# Patient Record
Sex: Female | Born: 2000 | Race: Black or African American | Hispanic: No | Marital: Single | Smoking: Never smoker
Health system: Southern US, Community
[De-identification: ages and names within clinical notes are randomized; demographics above are authoritative.]

## PROBLEM LIST (undated history)

## (undated) HISTORY — PX: WISDOM TOOTH EXTRACTION: SHX21

---
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
## 2020-02-10 ENCOUNTER — Other Ambulatory Visit: Payer: Self-pay

## 2020-02-10 ENCOUNTER — Ambulatory Visit: Payer: BC Managed Care – PPO | Admitting: Family Medicine

## 2020-02-10 ENCOUNTER — Encounter: Payer: Self-pay | Admitting: Family Medicine

## 2020-02-10 VITALS — BP 120/72 | HR 63 | Temp 98.3°F | Resp 16 | Ht 69.0 in | Wt 177.4 lb

## 2020-02-10 DIAGNOSIS — Z23 Encounter for immunization: Secondary | ICD-10-CM

## 2020-02-10 DIAGNOSIS — Z0000 Encounter for general adult medical examination without abnormal findings: Secondary | ICD-10-CM

## 2020-02-10 DIAGNOSIS — B360 Pityriasis versicolor: Secondary | ICD-10-CM | POA: Insufficient documentation

## 2020-02-10 DIAGNOSIS — R0789 Other chest pain: Secondary | ICD-10-CM

## 2020-02-10 DIAGNOSIS — E049 Nontoxic goiter, unspecified: Secondary | ICD-10-CM | POA: Insufficient documentation

## 2020-02-10 DIAGNOSIS — J4520 Mild intermittent asthma, uncomplicated: Secondary | ICD-10-CM

## 2020-02-10 HISTORY — DX: Pityriasis versicolor: B36.0

## 2020-02-10 HISTORY — DX: Other chest pain: R07.89

## 2020-02-10 MED ORDER — KETOCONAZOLE 1 % EX SHAM: MEDICATED_SHAMPOO | CUTANEOUS | 5 refills | Status: DC

## 2020-02-10 NOTE — Progress Notes (Signed)
Subjective:    Patient ID: Allison Campbell, female    DOB: 03-Dec-2000, 20 y.o.   MRN: [id number]  Chief Complaint  Patient presents with  . Establish Care    HPI Patient is in today for new patient appointment. She is doing well today and denies any recent febrile illness or hospitalizations. She has a medical history for childhood asthma which now really only flares when she gets a respiratory virus. She has noted a rash on her back for years worse in the summer. Also fleeting chest pains occur with certain movements, have occurred for years and does not have associated symptoms she follows with OB/GYN since starting [facility] for irregular cycles. She is in college at [employer]. Denies palp/SOB/HA/congestion/fevers/GI or GU c/o. Taking meds as prescribed  Past Medical History:  Diagnosis Date  . Asthma    cough variant worse with infection  . Mild intermittent asthma 07/06/2011  . Tinea versicolor 02/10/2020    Past Surgical History:  Procedure Laterality Date  . WISDOM TOOTH EXTRACTION      Family History  Problem Relation Age of Onset  . Diabetes Maternal Grandfather   . Stroke Paternal Grandmother   . Edema Paternal Grandmother   . Heart disease Paternal Grandfather   . Thyroid disease Mother        thyroid cancer  . Hypothyroidism Mother   . Hypertension Father   . Hypothyroidism Maternal Grandmother   . Asthma Maternal Grandmother   . Cancer Neg Hx     Social History   Socioeconomic History  . Marital status: Single    Spouse name: Not on file  . Number of children: Not on file  . Years of education: Not on file  . Highest education level: Not on file  Occupational History  . Not on file  Tobacco Use  . Smoking status: Never Smoker  . Smokeless tobacco: Never Used  Vaping Use  . Vaping Use: Never used  Substance and Sexual Activity  . Alcohol use: No  . Drug use: No  . Sexual activity: Not on file  Other Topics Concern  . Not on file  Social  History Narrative   Lives with parents, along with 2 nephews.  2 siblings ([place] and [place]).  No passive tobacco exposure.   No dietary restrictions except no pork   [employer] as a Consulting civil engineer in Careers adviser, Programmer, applications at gym, yoga    Social Determinants of Health   Financial Resource Strain: Not on file  Food Insecurity: Not on file  Transportation Needs: Not on file  Physical Activity: Not on file  Stress: Not on file  Social Connections: Not on file  Intimate Partner Violence: Not on file    Outpatient Medications Prior to Visit  Medication Sig Dispense Refill  . albuterol (PROVENTIL HFA;VENTOLIN HFA) 108 (90 Base) MCG/ACT inhaler Inhale 2 puffs into the lungs every 6 (six) hours as needed for wheezing or shortness of breath. Use 20-30 mins prior to exercise 18 g 0  . cetirizine-pseudoephedrine (ZYRTEC-D) 5-120 MG tablet Take 1 tablet by mouth daily. 15 tablet 0  . norethindrone-ethinyl estradiol (LOESTRIN FE) 1-20 MG-MCG tablet Take 1 tablet by mouth daily.    . ondansetron (ZOFRAN ODT) 4 MG disintegrating tablet Take 1 tablet (4 mg total) by mouth every 8 (eight) hours as needed for nausea or vomiting. 8 tablet 0   No facility-administered medications prior to visit.    Allergies  Allergen Reactions  .  Amoxicillin   . Penicillins Hives    Review of Systems  Constitutional: Negative for chills, fever and malaise/fatigue.  HENT: Negative for congestion and hearing loss.   Eyes: Negative for discharge.  Respiratory: Negative for cough, sputum production and shortness of breath.   Cardiovascular: Positive for chest pain. Negative for palpitations and leg swelling.  Gastrointestinal: Negative for abdominal pain, blood in stool, constipation, diarrhea, heartburn, nausea and vomiting.  Genitourinary: Negative for dysuria, frequency, hematuria and urgency.  Musculoskeletal: Negative for back pain, falls and myalgias.  Skin: Positive for rash.  Neurological:  Negative for dizziness, sensory change, loss of consciousness, weakness and headaches.  Endo/Heme/Allergies: Negative for environmental allergies. Does not bruise/bleed easily.  Psychiatric/Behavioral: Negative for depression and suicidal ideas. The patient is not nervous/anxious and does not have insomnia.        Objective:    Physical Exam Constitutional:      General: She is not in acute distress.    Appearance: She is well-developed and well-nourished.  HENT:     Head: Normocephalic and atraumatic.  Eyes:     Conjunctiva/sclera: Conjunctivae normal.  Neck:     Thyroid: No thyromegaly.  Cardiovascular:     Rate and Rhythm: Normal rate and regular rhythm.     Heart sounds: Normal heart sounds. No murmur heard.   Pulmonary:     Effort: Pulmonary effort is normal. No respiratory distress.     Breath sounds: Normal breath sounds.  Abdominal:     General: Bowel sounds are normal. There is no distension.     Palpations: Abdomen is soft. There is no mass.     Tenderness: There is no abdominal tenderness.  Musculoskeletal:        General: No edema.     Cervical back: Neck supple.  Lymphadenopathy:     Cervical: No cervical adenopathy.  Skin:    General: Skin is warm and dry.     Findings: Rash present.     Comments: Macular lesion scaly and grayish on upper back.   Neurological:     Mental Status: She is alert and oriented to person, place, and time.  Psychiatric:        Mood and Affect: Mood and affect normal.        Behavior: Behavior normal.     BP 120/72   Pulse 63   Temp 98.3 F (36.8 C)   Resp 16   Ht 5\' 9"  (1.753 m)   Wt 177 lb 6.4 oz (80.5 kg)   SpO2 99%   BMI 26.20 kg/m  Wt Readings from Last 3 Encounters:  02/10/20 177 lb 6.4 oz (80.5 kg)  03/07/17 167 lb 15.9 oz (76.2 kg) (93 %, Z= 1.50)*  10/27/14 155 lb 8 oz (70.5 kg) (92 %, Z= 1.43)*   * Growth percentiles are based on CDC (Girls, 2-20 Years) data.    Diabetic Foot Exam - Simple   No data  filed    No results found for: WBC, HGB, HCT, PLT, GLUCOSE, CHOL, TRIG, HDL, LDLDIRECT, LDLCALC, ALT, AST, NA, K, CL, CREATININE, BUN, CO2, TSH, PSA, INR, GLUF, HGBA1C, MICROALBUR  No results found for: TSH No results found for: WBC, HGB, HCT, MCV, PLT No results found for: NA, K, CHLORIDE, CO2, GLUCOSE, BUN, CREATININE, BILITOT, ALKPHOS, AST, ALT, PROT, ALBUMIN, CALCIUM, ANIONGAP, EGFR, GFR No results found for: CHOL No results found for: HDL No results found for: LDLCALC No results found for: TRIG No results found for: CHOLHDL No results  found for: HGBA1C     Assessment & Plan:   Problem List Items Addressed This Visit    Mild intermittent asthma    Really only needs it when she gets sick with a virus      Tinea versicolor    On her back present for years and worse in the summer, will try Ketoconazole shampoo to use twice a week as needed.       Preventative health care    Patient encouraged to maintain heart healthy diet, regular exercise, adequate sleep. Consider daily probiotics. Take medications as prescribed. She follows with OB/GYN. Check labs today and request old records from her pediatrician. Given her first Gardisil shot today, will need another in 1 month and the final one in 6 months      Enlarged thyroid    Diffuse and a family history of hypothyroidism and thyroid cancer. Will check labs today and consider [place]       Relevant Orders   TSH   T4, free   Atypical chest pain - Primary    Occurs with certain movements and lasts only seconds, has been happening off and on for years and is not worsening and does not have associated symptoms. Most likely musculoskeletal in nature she will report if her symptoms change or worsen so we can investigate further.       Relevant Orders   Comprehensive metabolic panel   CBC    Other Visit Diagnoses    Need for HPV vaccination       Relevant Orders   HPV vaccine quadrivalent 3 dose IM (Completed)      I have  discontinued Aairah Hara's ondansetron. I am also having her start on KETOCONAZOLE (TOPICAL). Additionally, I am having her maintain her cetirizine-pseudoephedrine, albuterol, and norethindrone-ethinyl estradiol.  Meds ordered this encounter  Medications  . KETOCONAZOLE, TOPICAL, 1 % SHAM    Sig: Wash twice weekly as needed    Dispense:  200 mL    Refill:  5     Penni Homans, MD

## 2020-02-10 NOTE — Assessment & Plan Note (Signed)
Really only needs it when she gets sick with a virus

## 2020-02-10 NOTE — Assessment & Plan Note (Signed)
Occurs with certain movements and lasts only seconds, has been happening off and on for years and is not worsening and does not have associated symptoms. Most likely musculoskeletal in nature she will report if her symptoms change or worsen so we can investigate further.

## 2020-02-10 NOTE — Assessment & Plan Note (Addendum)
Patient encouraged to maintain heart healthy diet, regular exercise, adequate sleep. Consider daily probiotics. Take medications as prescribed. She follows with OB/GYN. Check labs today and request old records from her pediatrician. Given her first Gardisil shot today, will need another in 1 month and the final one in 6 months

## 2020-02-10 NOTE — Assessment & Plan Note (Signed)
Diffuse and a family history of hypothyroidism and thyroid cancer. Will check labs today and consider [place]

## 2020-02-10 NOTE — Patient Instructions (Signed)
Preventive Care 21-20 Years Old, Female Preventive care refers to lifestyle choices and visits with your health care provider that can promote health and wellness. This includes:  A yearly physical exam. This is also called an annual wellness visit.  Regular dental and eye exams.  Immunizations.  Screening for certain conditions.  Healthy lifestyle choices, such as: ? Eating a healthy diet. ? Getting regular exercise. ? Not using drugs or products that contain nicotine and tobacco. ? Limiting alcohol use. What can I expect for my preventive care visit? Physical exam Your health care provider may check your:  Height and weight. These may be used to calculate your BMI (body mass index). BMI is a measurement that tells if you are at a healthy weight.  Heart rate and blood pressure.  Body temperature.  Skin for abnormal spots. Counseling Your health care provider may ask you questions about your:  Past medical problems.  Family's medical history.  Alcohol, tobacco, and drug use.  Emotional well-being.  Home life and relationship well-being.  Sexual activity.  Diet, exercise, and sleep habits.  Work and work environment.  Access to firearms.  Method of birth control.  Menstrual cycle.  Pregnancy history. What immunizations do I need? Vaccines are usually given at various ages, according to a schedule. Your health care provider will recommend vaccines for you based on your age, medical history, and lifestyle or other factors, such as travel or where you work.   What tests do I need? Blood tests  Lipid and cholesterol levels. These may be checked every 5 years starting at age 20.  Hepatitis C test.  Hepatitis B test. Screening  Diabetes screening. This is done by checking your blood sugar (glucose) after you have not eaten for a while (fasting).  STD (sexually transmitted disease) testing, if you are at risk.  BRCA-related cancer screening. This may be  done if you have a family history of breast, ovarian, tubal, or peritoneal cancers.  Pelvic exam and Pap test. This may be done every 3 years starting at age 21. Starting at age 30, this may be done every 5 years if you have a Pap test in combination with an HPV test. Talk with your health care provider about your test results, treatment options, and if necessary, the need for more tests.   Follow these instructions at home: Eating and drinking  Eat a healthy diet that includes fresh fruits and vegetables, whole grains, lean protein, and low-fat dairy products.  Take vitamin and mineral supplements as recommended by your health care provider.  Do not drink alcohol if: ? Your health care provider tells you not to drink. ? You are pregnant, may be pregnant, or are planning to become pregnant.  If you drink alcohol: ? Limit how much you have to 0-1 drink a day. ? Be aware of how much alcohol is in your drink. In the [place] one drink equals one 12 oz bottle of beer (355 mL), one 5 oz glass of wine (148 mL), or one 1 oz glass of hard liquor (44 mL).   Lifestyle  Take daily care of your teeth and gums. Brush your teeth every morning and night with fluoride toothpaste. Floss one time each day.  Stay active. Exercise for at least 30 minutes 5 or more days each week.  Do not use any products that contain nicotine or tobacco, such as cigarettes, e-cigarettes, and chewing tobacco. If you need help quitting, ask your health care provider.  Do not   use drugs.  If you are sexually active, practice safe sex. Use a condom or other form of protection to prevent STIs (sexually transmitted infections).  If you do not wish to become pregnant, use a form of birth control. If you plan to become pregnant, see your health care provider for a prepregnancy visit.  Find healthy ways to cope with stress, such as: ? Meditation, yoga, or listening to music. ? Journaling. ? Talking to a trusted  person. ? Spending time with friends and family. Safety  Always wear your seat belt while driving or riding in a vehicle.  Do not drive: ? If you have been drinking alcohol. Do not ride with someone who has been drinking. ? When you are tired or distracted. ? While texting.  Wear a helmet and other protective equipment during sports activities.  If you have firearms in your house, make sure you follow all gun safety procedures.  Seek help if you have been physically or sexually abused. What's next?  Go to your health care provider once a year for an annual wellness visit.  Ask your health care provider how often you should have your eyes and teeth checked.  Stay up to date on all vaccines. This information is not intended to replace advice given to you by your health care provider. Make sure you discuss any questions you have with your health care provider. Document Revised: 08/25/2019 Document Reviewed: 09/07/2017 Elsevier Patient Education  2021 Elsevier Inc.  

## 2020-02-10 NOTE — Assessment & Plan Note (Signed)
On her back present for years and worse in the summer, will try Ketoconazole shampoo to use twice a week as needed.

## 2020-02-11 LAB — CBC
HCT: 35.5 % — ABNORMAL LOW (ref 36.0–46.0)
Hemoglobin: 11.8 g/dL — ABNORMAL LOW (ref 12.0–15.0)
MCHC: 33.2 g/dL (ref 30.0–36.0)
MCV: 86.7 fl (ref 78.0–100.0)
Platelets: 256 10*3/uL (ref 150.0–400.0)
RBC: 4.09 Mil/uL (ref 3.87–5.11)
RDW: 13.3 % (ref 11.5–14.6)
WBC: 8.5 10*3/uL (ref 4.5–10.5)

## 2020-02-11 LAB — COMPREHENSIVE METABOLIC PANEL
ALT: 11 U/L (ref 0–35)
AST: 15 U/L (ref 0–37)
Albumin: 4.6 g/dL (ref 3.5–5.2)
Alkaline Phosphatase: 54 U/L (ref 39–117)
BUN: 7 mg/dL (ref 6–23)
CO2: 28 mEq/L (ref 19–32)
Calcium: 9.7 mg/dL (ref 8.4–10.5)
Chloride: 103 mEq/L (ref 96–112)
Creatinine, Ser: 0.73 mg/dL (ref 0.40–1.20)
GFR: 118.69 mL/min (ref >60.00)
Glucose, Bld: 89 mg/dL (ref 70–99)
Potassium: 3.9 mEq/L (ref 3.5–5.1)
Sodium: 138 mEq/L (ref 135–145)
Total Bilirubin: 0.7 mg/dL (ref 0.2–1.2)
Total Protein: 7.5 g/dL (ref 6.0–8.3)

## 2020-02-11 LAB — TSH: TSH: 0.97 u[IU]/mL (ref 0.35–5.50)

## 2020-02-11 LAB — T4, FREE: Free T4: 0.86 ng/dL (ref 0.60–1.60)

## 2020-02-12 ENCOUNTER — Other Ambulatory Visit: Payer: Self-pay

## 2020-02-12 DIAGNOSIS — D649 Anemia, unspecified: Secondary | ICD-10-CM

## 2020-03-17 ENCOUNTER — Ambulatory Visit: Payer: BC Managed Care – PPO

## 2020-03-17 ENCOUNTER — Other Ambulatory Visit: Payer: BC Managed Care – PPO

## 2020-04-07 ENCOUNTER — Ambulatory Visit: Payer: BC Managed Care – PPO

## 2020-05-19 ENCOUNTER — Telehealth: Payer: BC Managed Care – PPO | Admitting: Family Medicine

## 2020-08-14 ENCOUNTER — Ambulatory Visit: Payer: BC Managed Care – PPO

## 2020-08-14 NOTE — Progress Notes (Deleted)
Patient here for 2nd Gardasil vaccine.    Vaccine given in   and patient tolerated well.

## 2020-12-17 ENCOUNTER — Ambulatory Visit (INDEPENDENT_AMBULATORY_CARE_PROVIDER_SITE_OTHER): Payer: BC Managed Care – PPO

## 2020-12-17 ENCOUNTER — Other Ambulatory Visit: Payer: Self-pay

## 2020-12-17 ENCOUNTER — Encounter (HOSPITAL_COMMUNITY): Payer: Self-pay

## 2020-12-17 ENCOUNTER — Ambulatory Visit (HOSPITAL_COMMUNITY)
Admission: EM | Admit: 2020-12-17 | Discharge: 2020-12-17 | Disposition: A | Discharge status: Home / Self Care | Payer: BC Managed Care – PPO | Attending: Physician Assistant | Admitting: Physician Assistant

## 2020-12-17 DIAGNOSIS — R0789 Other chest pain: Secondary | ICD-10-CM | POA: Insufficient documentation

## 2020-12-17 DIAGNOSIS — R079 Chest pain, unspecified: Secondary | ICD-10-CM

## 2020-12-17 DIAGNOSIS — Z3202 Encounter for pregnancy test, result negative: Secondary | ICD-10-CM

## 2020-12-17 LAB — POCT URINALYSIS DIPSTICK, ED / UC
Bilirubin Urine: NEGATIVE
Glucose, UA: NEGATIVE mg/dL
Hgb urine dipstick: NEGATIVE
Ketones, ur: NEGATIVE mg/dL
Nitrite: NEGATIVE
Protein, ur: NEGATIVE mg/dL
Specific Gravity, Urine: 1.02 (ref 1.005–1.030)
Urobilinogen, UA: 0.2 mg/dL (ref 0.0–1.0)
pH: 6.5 (ref 5.0–8.0)

## 2020-12-17 LAB — POC URINE PREG, ED: Preg Test, Ur: NEGATIVE

## 2020-12-17 NOTE — ED Triage Notes (Signed)
Pt presents with c/o chest pain and blurred vision. Pt states this morning while she was driving she felt her whole body numb.

## 2020-12-17 NOTE — ED Provider Notes (Signed)
[facility]    CSN: [id number] Arrival date & time: 12/17/20  0807      History   Chief Complaint Chief Complaint  Patient presents with   Chest Pain   Blurred Vision    HPI Allison Campbell is a 20 y.o. female.   Patient presents today with a several hour history of persistent chest pain.  Reports that she was driving her cousin to school when she developed a sudden severe chest pain.  She then felt extreme numbness throughout her whole body as well as blurred vision.  She called EMS and her vital signs were normal she was told to follow-up with urgent care.  She reports that numbness and blurred vision have since resolved.  She continues to have chest pain that has improved.  During episode is rated 10 on a 0-10 pain scale but is currently rated 5, described as sharp/stabbing, worse with deep breathing, no relieving factors identified.  She has not tried any over-the-counter medication for symptom management.  She denies history of cardiovascular condition.  She did experience the flu in November but has since resolved.  She has continued to have asthma symptoms since that time and has been using albuterol inhaler more frequently since onset.  She did try her albuterol inhaler with symptoms and this exacerbated pain.  She does report some shortness of breath but denies any nausea, vomiting, lightheadedness, dizziness.  She denies family history of cardiovascular disease or personal history of diabetes, hypertension, smoking, hyperlipidemia.  She denies any exacerbation of symptoms with activity and reports that episode began when she was sitting and driving.  She is in college but denies any significant stressors.  Does report that she has not been sleeping and is unsure if this is contributing to symptoms.   Past Medical History:  Diagnosis Date   Asthma    cough variant worse with infection   Mild intermittent asthma 07/06/2011   Tinea versicolor 02/10/2020    Patient Active  Problem List   Diagnosis Date Noted   Tinea versicolor 02/10/2020   Preventative health care 02/10/2020   Enlarged thyroid 02/10/2020   Atypical chest pain 02/10/2020   Mild intermittent asthma 07/06/2011    Past Surgical History:  Procedure Laterality Date   WISDOM TOOTH EXTRACTION      OB History   No obstetric history on file.      Home Medications    Prior to Admission medications   Medication Sig Start Date End Date Taking? Authorizing Provider  albuterol (PROVENTIL HFA;VENTOLIN HFA) 108 (90 Base) MCG/ACT inhaler Inhale 2 puffs into the lungs every 6 (six) hours as needed for wheezing or shortness of breath. Use 20-30 mins prior to exercise 12/17/16   Cathie Hoops, Amy V, PA-C  cetirizine-pseudoephedrine (ZYRTEC-D) 5-120 MG tablet Take 1 tablet by mouth daily. 12/17/16   Cathie Hoops, Amy V, PA-C  KETOCONAZOLE, TOPICAL, 1 % SHAM Wash twice weekly as needed 02/10/20   Bradd Canary, MD  norethindrone-ethinyl estradiol (LOESTRIN FE) 1-20 MG-MCG tablet Take 1 tablet by mouth daily.    [provider]    Family History Family History  Problem Relation Age of Onset   Diabetes Maternal Grandfather    Stroke Paternal Grandmother    Edema Paternal Grandmother    Heart disease Paternal Grandfather    Thyroid disease Mother        thyroid cancer   Hypothyroidism Mother    Hypertension Father    Hypothyroidism Maternal Grandmother    Asthma  Maternal Grandmother    Cancer Neg Hx     Social History Social History   Tobacco Use   Smoking status: Never   Smokeless tobacco: Never  Vaping Use   Vaping Use: Never used  Substance Use Topics   Alcohol use: No   Drug use: No     Allergies   Amoxicillin and Penicillins   Review of Systems Review of Systems  Constitutional:  Positive for activity change. Negative for appetite change, fatigue and fever.  Respiratory:  Negative for cough, chest tightness and shortness of breath.   Cardiovascular:  Positive for chest pain. Negative  for palpitations and leg swelling.  Gastrointestinal:  Negative for abdominal pain, diarrhea, nausea and vomiting.  Musculoskeletal:  Negative for arthralgias and myalgias.  Neurological:  Negative for dizziness, light-headedness and headaches.    Physical Exam Triage Vital Signs ED Triage Vitals  Enc Vitals Group     BP 12/17/20 0830 107/71     Pulse Rate 12/17/20 0830 77     Resp 12/17/20 0830 19     Temp 12/17/20 0830 98.7 F (37.1 C)     Temp Source 12/17/20 0830 Oral     SpO2 12/17/20 0830 98 %     Weight --      Height --      Head Circumference --      Peak Flow --      Pain Score 12/17/20 0829 4     Pain Loc --      Pain Edu? --      Excl. in GC? --    No data found.  Updated Vital Signs BP 107/71 (BP Location: Left Arm)   Pulse 77   Temp 98.7 F (37.1 C) (Oral)   Resp 19   LMP 11/29/2020 (Exact Date)   SpO2 98%   Visual Acuity Right Eye Distance:   Left Eye Distance:   Bilateral Distance:    Right Eye Near:   Left Eye Near:    Bilateral Near:     Physical Exam Vitals reviewed.  Constitutional:      General: She is awake. She is not in acute distress.    Appearance: Normal appearance. She is well-developed. She is not ill-appearing.     Comments: Very pleasant female appears stated age no acute distress sitting comfortably in exam room  HENT:     Head: Normocephalic and atraumatic.     Mouth/Throat:     Mouth: Mucous membranes are moist.     Pharynx: Uvula midline. No oropharyngeal exudate or posterior oropharyngeal erythema.  Cardiovascular:     Rate and Rhythm: Normal rate and regular rhythm.     Heart sounds: Normal heart sounds, S1 normal and S2 normal. No murmur heard. Pulmonary:     Effort: Pulmonary effort is normal.     Breath sounds: Normal breath sounds. No wheezing, rhonchi or rales.     Comments: Clear to auscultation bilaterally Chest:     Chest wall: No deformity, swelling or tenderness.  Abdominal:     General: Bowel sounds are  normal.     Palpations: Abdomen is soft.     Tenderness: There is no abdominal tenderness. There is no right CVA tenderness, left CVA tenderness, guarding or rebound.  Psychiatric:        Behavior: Behavior is cooperative.     UC Treatments / Results  Labs (all labs ordered are listed, but only abnormal results are displayed) Labs Reviewed  POCT URINALYSIS DIPSTICK, ED /  UC - Abnormal; Notable for the following components:      Result Value   Leukocytes,Ua SMALL (*)    All other components within normal limits  URINE CULTURE  POC URINE PREG, ED    EKG   Radiology DG Chest 2 View  Result Date: 12/17/2020 CLINICAL DATA:  Chest pain, asthma EXAM: CHEST - 2 VIEW COMPARISON:  None. FINDINGS: The heart size and mediastinal contours are within normal limits. Both lungs are clear. The visualized skeletal structures are unremarkable. IMPRESSION: No active cardiopulmonary disease. Electronically Signed   By: Charlett Nose M.D.   On: 12/17/2020 09:09    Procedures Procedures (including critical care time)  Medications Ordered in UC Medications - No data to display  Initial Impression / Assessment and Plan / UC Course  I have reviewed the triage vital signs and the nursing notes.  Pertinent labs & imaging results that were available during my care of the patient were reviewed by me and considered in my medical decision making (see chart for details).      EKG obtained showed normal sinus rhythm with ventricular rate of 66 bpm and no ischemic changes with no previous to compare.  Chest x-ray obtained showed no acute abnormalities.  Urine pregnancy was negative.  UA showed small leukocyte esterase without significant dehydration.  She denies any significant symptoms of UTI so we will send off for culture but defer antibiotics.  Low suspicion for ACS given clinical presentation.  Discussed that we are unable to completely rule out a cardiac etiology given we do not have access to troponins  and additional lab work, however, given patient has had improving symptoms I suspect that her symptoms are related to exhaustion and anxiety.  Discussed that she could go to the emergency room for further evaluation and management but she is interested in holding off on this today.  She will go home and rest and drink plenty of fluid.  Discussed that if symptoms persist or worsen she must go to the ER for further evaluation and management.  Recommend she follow-up with her primary care provider within a week.  Strict return precautions given to which she expressed understanding.  Final Clinical Impressions(s) / UC Diagnoses   Final diagnoses:  Chest tightness     Discharge Instructions      Your EKG and chest x-ray look fine.  Please go home and drink plenty of fluid.  Avoid caffeine.  Make sure you are resting.  If you have any recurrence of symptoms including chest discomfort, shortness of breath, nausea, vomiting, lightheadedness please go to the emergency room for further evaluation as we discussed.     ED Prescriptions   None    PDMP not reviewed this encounter.   Jeani Hawking, PA-C 12/17/20 1005

## 2020-12-17 NOTE — Discharge Instructions (Signed)
Your EKG and chest x-ray look fine.  Please go home and drink plenty of fluid.  Avoid caffeine.  Make sure you are resting.  If you have any recurrence of symptoms including chest discomfort, shortness of breath, nausea, vomiting, lightheadedness please go to the emergency room for further evaluation as we discussed.

## 2020-12-18 LAB — URINE CULTURE

## 2020-12-23 ENCOUNTER — Ambulatory Visit: Payer: BC Managed Care – PPO | Admitting: Family

## 2020-12-23 VITALS — BP 119/50 | HR 62 | Temp 98.3°F | Resp 16 | Ht 69.0 in | Wt 177.0 lb

## 2020-12-23 DIAGNOSIS — R0789 Other chest pain: Secondary | ICD-10-CM

## 2020-12-23 NOTE — Assessment & Plan Note (Signed)
Resolved. Doubt cardiac etiology. She is working hard on taking better care of herself and is feeling much better.  Will continue to monitor. She is advised to call if she develops recurrent symptoms.

## 2020-12-23 NOTE — Patient Instructions (Signed)
Please call if recurrent chest pain. Continue your self care as you have been doing.

## 2020-12-23 NOTE — Progress Notes (Signed)
Subjective:   By signing my name below, I, Shehryar Baig, attest that this documentation has been prepared under the direction and in the presence of [name] NP. 12/23/2020    Patient ID: Allison Campbell, female    DOB: [birth year], 20 y.o.   MRN: [id number]  Chief Complaint  Patient presents with   Follow-up    Here for Follow Up Urgent Care Visit     HPI Patient is in today for a urgent care follow up. She visited an urgent care clinic on 12/17/2020 for chest pain. Her chest x-ray were normal. Her EKG results were normal. She reports dropping off her cousin and planning on sleeping at home at the time of her chest pain. She does not know if she was having a panic attack due to not knowing how it feels.  Since then her symptoms have improved. She reports her sleep schedule is erratic. Since then she is sleeping around 10 pm every day. She reports having SOB due to the cold whether and having asthma. She is taking a pump to manage her symptoms and finds relief.  Immunizations- She received a Covid-19 vaccine. She is not interested in receiving a flu vaccine at this time.    Health Maintenance Due  Topic Date Due   Pneumococcal Vaccine 36-57 Years old (1 - PCV) Never done   HIV Screening  Never done   Hepatitis C Screening  Never done   COVID-19 Vaccine (3 - Booster for Pfizer series) 11/08/2019   HPV VACCINES (2 - 3-dose series) 03/09/2020   INFLUENZA VACCINE  Never done    Past Medical History:  Diagnosis Date   Asthma    cough variant worse with infection   Mild intermittent asthma 07/06/2011   Tinea versicolor 02/10/2020    Past Surgical History:  Procedure Laterality Date   WISDOM TOOTH EXTRACTION      Family History  Problem Relation Age of Onset   Diabetes Maternal Grandfather    Stroke Paternal Grandmother    Edema Paternal Grandmother    Heart disease Paternal Grandfather    Thyroid disease Mother        thyroid cancer   Hypothyroidism Mother     Hypertension Father    Hypothyroidism Maternal Grandmother    Asthma Maternal Grandmother    Cancer Neg Hx     Social History   Socioeconomic History   Marital status: Single    Spouse name: Not on file   Number of children: Not on file   Years of education: Not on file   Highest education level: Not on file  Occupational History   Not on file  Tobacco Use   Smoking status: Never   Smokeless tobacco: Never  Vaping Use   Vaping Use: Never used  Substance and Sexual Activity   Alcohol use: No   Drug use: No   Sexual activity: Not on file  Other Topics Concern   Not on file  Social History Narrative   Lives with parents, along with 2 nephews.  2 siblings ([place] and [place]).  No passive tobacco exposure.   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 Use 20-30 mins prior to exercise 18 g 0   cetirizine-pseudoephedrine (ZYRTEC-D) 5-120 MG tablet Take 1 tablet by mouth daily. 15 tablet 0   KETOCONAZOLE, TOPICAL, 1 % SHAM Wash twice weekly as needed 200 mL 5   norethindrone-ethinyl estradiol-FE (LOESTRIN FE) 1-20 MG-MCG tablet Take 1 tablet by mouth daily.     No facility-administered medications prior to visit.    Allergies  Allergen Reactions   Amoxicillin    Penicillins Hives    ROS     Objective:    Physical Exam Constitutional:      General: She is not in acute distress.     Appearance: Normal appearance. She is not ill-appearing.  HENT:     Head: Normocephalic and atraumatic.     Right Ear: External ear normal.     Left Ear: External ear normal.  Eyes:     Extraocular Movements: Extraocular movements intact.     Pupils: Pupils are equal, round, and reactive to light.  Cardiovascular:     Rate and Rhythm: Normal rate and regular rhythm.     Heart sounds: Normal heart sounds. No murmur heard.   No gallop.  Pulmonary:     Effort: Pulmonary effort is normal. No respiratory distress.     Breath sounds: Normal breath sounds. No wheezing or rales.  Skin:    General: Skin is warm and dry.  Neurological:     Mental Status: She is alert and oriented to person, place, and time.  Psychiatric:        Behavior: Behavior normal.    BP (!) 119/50 (BP Location: Right Arm, Patient Position: Sitting, Cuff Size: Small)    Pulse 62    Temp 98.3 F (36.8 C) (Oral)    Resp 16    Ht 5\' 9"  (1.753 m)    Wt 177 lb (80.3 kg)    LMP 11/29/2020 (Exact Date)    SpO2 100%    BMI 26.14 kg/m  Wt Readings from Last 3 Encounters:  12/23/20 177 lb (80.3 kg)  02/10/20 177 lb 6.4 oz (80.5 kg)  03/07/17 167 lb 15.9 oz (76.2 kg) (93 %, Z= 1.50)*   * Growth percentiles are based on CDC (Girls, 2-20 Years) data.       Assessment & Plan:   Problem List Items Addressed This Visit       Unprioritized   Atypical chest pain - Primary    Resolved. Doubt cardiac etiology. She is working hard on taking better care of herself and is feeling much better.  Will continue to monitor. She is advised to call if she develops recurrent symptoms.         No orders of the defined types were placed in this encounter.   I, 03/09/17 NP, personally preformed the services described in this documentation.  All medical record entries made by the scribe were at my direction and in my presence.  I have reviewed the chart and discharge instructions (if applicable) and agree that the record  reflects my personal performance and is accurate and complete. 12/23/2020   I,Shehryar Baig,acting as a scribe for 12/25/2020, NP.,have documented all relevant documentation on the behalf of Ewin Rehberg  Arvil Chaco, NP,as directed by  Lemont Fillers, NP while in the presence of Lemont Fillers, NP.   Lemont Fillers, NP

## 2020-12-29 ENCOUNTER — Ambulatory Visit: Payer: BC Managed Care – PPO | Admitting: Family

## 2020-12-29 VITALS — BP 110/55 | HR 76 | Temp 98.1°F | Resp 16 | Ht 69.0 in | Wt 177.6 lb

## 2020-12-29 DIAGNOSIS — D649 Anemia, unspecified: Secondary | ICD-10-CM | POA: Diagnosis not present

## 2020-12-29 DIAGNOSIS — E559 Vitamin D deficiency, unspecified: Secondary | ICD-10-CM | POA: Diagnosis not present

## 2020-12-29 DIAGNOSIS — Z309 Encounter for contraceptive management, unspecified: Secondary | ICD-10-CM | POA: Insufficient documentation

## 2020-12-29 DIAGNOSIS — J4520 Mild intermittent asthma, uncomplicated: Secondary | ICD-10-CM

## 2020-12-29 DIAGNOSIS — R5383 Other fatigue: Secondary | ICD-10-CM | POA: Diagnosis not present

## 2020-12-29 LAB — CBC WITH DIFFERENTIAL/PLATELET
Basophils Absolute: 0 10*3/uL (ref 0.0–0.1)
Basophils Relative: 0.3 % (ref 0.0–3.0)
Eosinophils Absolute: 0.1 10*3/uL (ref 0.0–0.7)
Eosinophils Relative: 1.3 % (ref 0.0–5.0)
HCT: 33.1 % — ABNORMAL LOW (ref 36.0–46.0)
Hemoglobin: 11.1 g/dL — ABNORMAL LOW (ref 12.0–15.0)
Lymphocytes Relative: 22.6 % (ref 12.0–46.0)
Lymphs Abs: 2 10*3/uL (ref 0.7–4.0)
MCHC: 33.5 g/dL (ref 30.0–36.0)
MCV: 86.4 fl (ref 78.0–100.0)
Monocytes Absolute: 0.4 10*3/uL (ref 0.1–1.0)
Monocytes Relative: 4.5 % (ref 3.0–12.0)
Neutro Abs: 6.4 10*3/uL (ref 1.4–7.7)
Neutrophils Relative %: 71.3 % (ref 43.0–77.0)
Platelets: 245 10*3/uL (ref 150.0–400.0)
RBC: 3.83 Mil/uL — ABNORMAL LOW (ref 3.87–5.11)
RDW: 12.5 % (ref 11.5–14.6)
WBC: 8.9 10*3/uL (ref 4.5–10.5)

## 2020-12-29 LAB — FERRITIN: Ferritin: 81.4 ng/mL (ref 10.0–291.0)

## 2020-12-29 LAB — VITAMIN D 25 HYDROXY (VIT D DEFICIENCY, FRACTURES): VITD: 15.03 ng/mL — ABNORMAL LOW (ref 30.00–100.00)

## 2020-12-29 LAB — IRON: Iron: 101 ug/dL (ref 42–145)

## 2020-12-29 LAB — TSH: TSH: 0.95 u[IU]/mL (ref 0.35–5.50)

## 2020-12-29 MED ORDER — MONTELUKAST SODIUM 10 MG PO TABS: 10.0000 mg | ORAL_TABLET | Freq: Every day | ORAL | 3 refills | Status: DC

## 2020-12-29 NOTE — Patient Instructions (Addendum)
Please visit the lab before leaving today to have updated blood work performed.  Schedule annual physical exam for around first week of February.  Use condoms decrease chance of pregnancy and STD exposure.   Please schedule a follow up visit with your GYN to discuss alternative birth control options.

## 2020-12-29 NOTE — Assessment & Plan Note (Signed)
Recent increased frequency of cough. Will refill singulair at pt request.

## 2020-12-29 NOTE — Assessment & Plan Note (Signed)
Will check iron studies. Repeat CBC.

## 2020-12-29 NOTE — Assessment & Plan Note (Signed)
Declines OCP for birth control. She does not wish to become pregnant at this time.  She is not using birth control.  Recommended that she schedule an appointment with her GYN and use condoms consistently.

## 2020-12-29 NOTE — Progress Notes (Signed)
Subjective:   By signing my name below, I, Lyric Barr-McArthur, attest that this documentation has been prepared under the direction and in the presence of [name], NP, 12/29/2020    Patient ID: Allison Campbell, female    DOB: 10/29/2000, 20 y.o.   MRN: [id number]  Chief Complaint  Patient presents with   Follow-up    Follow up     HPI Patient is in today for an office visit.  Anemia: She has a history of anemia. She is wanting to have her blood work updated today to check on this.  Vitamin D: She has a history of low vitamin D levels. She notes that when she had low vitamin D levels she was feeling fatigued and weak and she is currently beginning to feel these symptoms again. She is wondering if this could be a factor to her symptoms so she would like to have her vitamin D levels checked.  Hair loss: She notes that she had Covid-19 her freshman and sophomore year of college and during both of these illnesses she began to loose hair. She notes that after her second time contracting Covid-19 her hair regrowth has been slower.  Cough: She complains of a persistent cough and is wanting to have her Singulair inhaler refilled.  Birth control: She is currently sexually active but is not using any birth control. She used to take the birth control pill but did not like the fact that it did not give her a menses. She is not interested in having children at this time and has been advised to use protection until a decision on birth control is made.  Anxiety: She has a history of anxiety and is wondering what she can do to help manage this.   Health Maintenance Due  Topic Date Due   Pneumococcal Vaccine 29-41 Years old (1 - PCV) Never done   HIV Screening  Never done   Hepatitis C Screening  Never done   COVID-19 Vaccine (3 - Booster for Pfizer series) 11/08/2019   HPV VACCINES (2 - 3-dose series) 03/09/2020   INFLUENZA VACCINE  Never done    Past Medical History:  Diagnosis Date    Asthma    cough variant worse with infection   Mild intermittent asthma 07/06/2011   Tinea versicolor 02/10/2020    Past Surgical History:  Procedure Laterality Date   WISDOM TOOTH EXTRACTION      Family History  Problem Relation Age of Onset   Diabetes Maternal Grandfather    Stroke Paternal Grandmother    Edema Paternal Grandmother    Heart disease Paternal Grandfather    Thyroid disease Mother        thyroid cancer   Hypothyroidism Mother    Hypertension Father    Hypothyroidism Maternal Grandmother    Asthma Maternal Grandmother    Cancer Neg Hx     Social History   Socioeconomic History   Marital status: Single    Spouse name: Not on file   Number of children: Not on file   Years of education: Not on file   Highest education level: Not on file  Occupational History   Not on file  Tobacco Use   Smoking status: Never   Smokeless tobacco: Never  Vaping Use   Vaping Use: Never used  Substance and Sexual Activity   Alcohol use: No   Drug use: No   Sexual activity: Not on file  Other Topics Concern   Not on file  Social  History Narrative   Lives with parents, along with 2 nephews.  2 siblings ([place] and [place]).  No passive tobacco exposure.   No dietary restrictions except no pork   [employer] as a Consulting civil engineer in Careers adviser, Programmer, applications at gym, yoga    Social Determinants of Health   Financial Resource Strain: Not on file  Food Insecurity: Not on file  Transportation Needs: Not on file  Physical Activity: Not on file  Stress: Not on file  Social Connections: Not on file  Intimate Partner Violence: Not on file    Outpatient Medications Prior to Visit  Medication Sig Dispense Refill   albuterol (PROVENTIL HFA;VENTOLIN HFA) 108 (90 Base) MCG/ACT inhaler Inhale 2 puffs into the lungs every 6 (six) hours as needed for wheezing or shortness of breath. Use 20-30 mins prior to exercise 18 g 0   cetirizine-pseudoephedrine (ZYRTEC-D) 5-120 MG  tablet Take 1 tablet by mouth daily. 15 tablet 0   KETOCONAZOLE, TOPICAL, 1 % SHAM Wash twice weekly as needed 200 mL 5   norethindrone-ethinyl estradiol-FE (LOESTRIN FE) 1-20 MG-MCG tablet Take 1 tablet by mouth daily.     No facility-administered medications prior to visit.    Allergies  Allergen Reactions   Amoxicillin    Penicillins Hives    Review of Systems  Constitutional:  Positive for malaise/fatigue.  Respiratory:  Positive for cough.       Objective:    Physical Exam Constitutional:      General: She is not in acute distress.    Appearance: Normal appearance. She is not ill-appearing.  HENT:     Head: Normocephalic and atraumatic.     Right Ear: External ear normal.     Left Ear: External ear normal.  Eyes:     Extraocular Movements: Extraocular movements intact.     Pupils: Pupils are equal, round, and reactive to light.  Cardiovascular:     Rate and Rhythm: Normal rate and regular rhythm.     Heart sounds: Normal heart sounds. No murmur heard.   No gallop.  Pulmonary:     Effort: Pulmonary effort is normal. No respiratory distress.     Breath sounds: Normal breath sounds. No wheezing or rales.  Lymphadenopathy:     Cervical: No cervical adenopathy.  Skin:    General: Skin is warm and dry.  Neurological:     Mental Status: She is alert and oriented to person, place, and time.  Psychiatric:        Behavior: Behavior normal.        Judgment: Judgment normal.    BP (!) 110/55 (BP Location: Right Arm, Patient Position: Sitting, Cuff Size: Small)    Pulse 76    Temp 98.1 F (36.7 C) (Oral)    Resp 16    Ht 5\' 9"  (1.753 m)    Wt 177 lb 9.6 oz (80.6 kg)    LMP 11/29/2020 (Exact Date)    SpO2 100%    BMI 26.23 kg/m  Wt Readings from Last 3 Encounters:  12/29/20 177 lb 9.6 oz (80.6 kg)  12/23/20 177 lb (80.3 kg)  02/10/20 177 lb 6.4 oz (80.5 kg)       Assessment & Plan:   Problem List Items Addressed This Visit       Unprioritized   Mild  intermittent asthma    Recent increased frequency of cough. Will refill singulair at pt request.       Relevant Medications   montelukast (  SINGULAIR) 10 MG tablet   Encounter for contraceptive management    Declines OCP for birth control. She does not wish to become pregnant at this time.  She is not using birth control.  Recommended that she schedule an appointment with her GYN and use condoms consistently.       Anemia    Will check iron studies. Repeat CBC.       Relevant Orders   CBC with Differential/Platelet   Iron   Ferritin   Other Visit Diagnoses     Vitamin D deficiency    -  Primary   Relevant Orders   Vitamin D (25 hydroxy)   Other fatigue       Relevant Orders   TSH      Meds ordered this encounter  Medications   montelukast (SINGULAIR) 10 MG tablet    Sig: Take 1 tablet (10 mg total) by mouth at bedtime.    Dispense:  30 tablet    Refill:  3    Order Specific Question:   Supervising Provider    Answer:   Danise Edge A [[id number]]    I, [name], NP, personally preformed the services described in this documentation.  All medical record entries made by the scribe were at my direction and in my presence.  I have reviewed the chart and discharge instructions (if applicable) and agree that the record reflects my personal performance and is accurate and complete. 12/29/2020  I,Lyric Barr-McArthur,acting as a Neurosurgeon for Lemont Fillers, NP.,have documented all relevant documentation on the behalf of Lemont Fillers, NP,as directed by  Lemont Fillers, NP while in the presence of Lemont Fillers, NP.  Lemont Fillers, NP

## 2020-12-30 ENCOUNTER — Telehealth: Payer: Self-pay | Admitting: Family

## 2020-12-30 DIAGNOSIS — E559 Vitamin D deficiency, unspecified: Secondary | ICD-10-CM

## 2020-12-30 MED ORDER — VITAMIN D (ERGOCALCIFEROL) 1.25 MG (50000 UNIT) PO CAPS: 50000.0000 [IU] | ORAL_CAPSULE | ORAL | 0 refills | Status: DC

## 2020-12-30 NOTE — Telephone Encounter (Signed)
Iron and thyroid levels look good.   Vitamin D level is low.  Advise patient to begin vit D 50000 units once weekly for 12 weeks, then repeat vit D level (dx Vit D deficiency).    She is mildly anemic. I would recommend that she add a multivitamin once daily.

## 2020-12-30 NOTE — Telephone Encounter (Signed)
Patient was call about labs and advises of all results, advises the patient of new medication vitamin D and schedule to co,me back in 3 months for labs

## 2021-02-11 ENCOUNTER — Encounter: Payer: BC Managed Care – PPO | Admitting: Family Medicine

## 2021-02-11 ENCOUNTER — Telehealth: Payer: Self-pay | Admitting: Family Medicine

## 2021-02-11 NOTE — Telephone Encounter (Signed)
Since pt was unable to make her appointment today, she was wondering if there was any way she could get a dr's note excusing her from class today. Please advise.

## 2021-02-11 NOTE — Telephone Encounter (Signed)
done

## 2021-02-15 ENCOUNTER — Telehealth: Payer: Self-pay | Admitting: Family Medicine

## 2021-02-15 NOTE — Telephone Encounter (Signed)
Patient had cpe sched 2/2 with blyth, patient came late and appt was resched.  Patient was advised that note can still be given since she missed class. Patient is requesting school note within my chart

## 2021-02-15 NOTE — Telephone Encounter (Signed)
done

## 2021-02-17 ENCOUNTER — Encounter: Payer: BC Managed Care – PPO | Admitting: Family

## 2021-02-17 NOTE — Progress Notes (Incomplete)
Subjective:   By signing my name below, I, Allison Campbell, attest that this documentation has been prepared under the direction and in the presence of Allison Craze, NP 02/17/2021     Patient ID: Allison Campbell, female    DOB: [birth year], 21 y.o.   MRN: [id number]  No chief complaint on file.   HPI Patient is in today for a comprehensive physical exam.  Pap Smear- Immunizations-  Past Medical History:  Diagnosis Date   Asthma    cough variant worse with infection   Mild intermittent asthma 07/06/2011   Tinea versicolor 02/10/2020    Past Surgical History:  Procedure Laterality Date   WISDOM TOOTH EXTRACTION      Family History  Problem Relation Age of Onset   Diabetes Maternal Grandfather    Stroke Paternal Grandmother    Edema Paternal Grandmother    Heart disease Paternal Grandfather    Thyroid disease Mother        thyroid cancer   Hypothyroidism Mother    Hypertension Father    Hypothyroidism Maternal Grandmother    Asthma Maternal Grandmother    Cancer Neg Hx     Social History   Socioeconomic History   Marital status: Single    Spouse name: Not on file   Number of children: Not on file   Years of education: Not on file   Highest education level: Not on file  Occupational History   Not on file  Tobacco Use   Smoking status: Never   Smokeless tobacco: Never  Vaping Use   Vaping Use: Never used  Substance and Sexual Activity   Alcohol use: No   Drug use: No   Sexual activity: Not on file  Other Topics Concern   Not on file  Social History Narrative   Lives with parents, along with 2 nephews.  2 siblings ([place] and [place]).  No passive tobacco exposure.   No dietary restrictions except no pork   [employer] as a Consulting civil engineer in Careers adviser, Programmer, applications at gym, yoga    Social Determinants of Health   Financial Resource Strain: Not on file  Food Insecurity: Not on file  Transportation Needs: Not on file  Physical Activity: Not  on file  Stress: Not on file  Social Connections: Not on file  Intimate Partner Violence: Not on file    Outpatient Medications Prior to Visit  Medication Sig Dispense Refill   albuterol (PROVENTIL HFA;VENTOLIN HFA) 108 (90 Base) MCG/ACT inhaler Inhale 2 puffs into the lungs every 6 (six) hours as needed for wheezing or shortness of breath. Use 20-30 mins prior to exercise 18 g 0   cetirizine-pseudoephedrine (ZYRTEC-D) 5-120 MG tablet Take 1 tablet by mouth daily. 15 tablet 0   KETOCONAZOLE, TOPICAL, 1 % SHAM Wash twice weekly as needed 200 mL 5   montelukast (SINGULAIR) 10 MG tablet Take 1 tablet (10 mg total) by mouth at bedtime. 30 tablet 3   norethindrone-ethinyl estradiol-FE (LOESTRIN FE) 1-20 MG-MCG tablet Take 1 tablet by mouth daily.     Vitamin D, Ergocalciferol, (DRISDOL) 1.25 MG (50000 UNIT) CAPS capsule Take 1 capsule (50,000 Units total) by mouth every 7 (seven) days. 12 capsule 0   No facility-administered medications prior to visit.    Allergies  Allergen Reactions   Amoxicillin    Penicillins Hives    Review of Systems  Constitutional:  Negative for fever.  HENT:  Negative for ear pain and hearing loss.        (-)  nystagmus (-)adenopathy  Eyes:  Negative for blurred vision.  Respiratory:  Negative for cough, shortness of breath and wheezing.   Cardiovascular:  Negative for chest pain and leg swelling.  Gastrointestinal:  Negative for blood in stool, diarrhea, nausea and vomiting.  Genitourinary:  Negative for dysuria and frequency.  Musculoskeletal:  Negative for joint pain and myalgias.  Skin:  Negative for rash.  Neurological:  Negative for headaches.  Psychiatric/Behavioral:  Negative for depression. The patient is not nervous/anxious.       Objective:    Physical Exam Constitutional:      General: She is not in acute distress.    Appearance: Normal appearance. She is not ill-appearing.  HENT:     Head: Normocephalic and atraumatic.     Right Ear:  External ear normal.     Left Ear: External ear normal.  Eyes:     Extraocular Movements: Extraocular movements intact.     Pupils: Pupils are equal, round, and reactive to light.  Cardiovascular:     Rate and Rhythm: Normal rate and regular rhythm.     Pulses: Normal pulses.     Heart sounds: Normal heart sounds. No murmur heard. Pulmonary:     Effort: Pulmonary effort is normal. No respiratory distress.     Breath sounds: Normal breath sounds. No wheezing or rhonchi.  Abdominal:     General: Bowel sounds are normal. There is no distension.     Palpations: Abdomen is soft.     Tenderness: There is no abdominal tenderness. There is no guarding or rebound.  Musculoskeletal:     Cervical back: Neck supple.  Lymphadenopathy:     Cervical: No cervical adenopathy.  Skin:    General: Skin is warm and dry.  Neurological:     Mental Status: She is alert and oriented to person, place, and time.  Psychiatric:        Behavior: Behavior normal.        Judgment: Judgment normal.    There were no vitals taken for this visit. Wt Readings from Last 3 Encounters:  12/29/20 177 lb 9.6 oz (80.6 kg)  12/23/20 177 lb (80.3 kg)  02/10/20 177 lb 6.4 oz (80.5 kg)    Diabetic Foot Exam - Simple   No data filed    Lab Results  Component Value Date   WBC 8.9 12/29/2020   HGB 11.1 (L) 12/29/2020   HCT 33.1 (L) 12/29/2020   PLT 245.0 12/29/2020   GLUCOSE 89 02/10/2020   ALT 11 02/10/2020   AST 15 02/10/2020   NA 138 02/10/2020   K 3.9 02/10/2020   CL 103 02/10/2020   CREATININE 0.73 02/10/2020   BUN 7 02/10/2020   CO2 28 02/10/2020   TSH 0.95 12/29/2020    Lab Results  Component Value Date   TSH 0.95 12/29/2020   Lab Results  Component Value Date   WBC 8.9 12/29/2020   HGB 11.1 (L) 12/29/2020   HCT 33.1 (L) 12/29/2020   MCV 86.4 12/29/2020   PLT 245.0 12/29/2020   Lab Results  Component Value Date   NA 138 02/10/2020   K 3.9 02/10/2020   CO2 28 02/10/2020   GLUCOSE 89  02/10/2020   BUN 7 02/10/2020   CREATININE 0.73 02/10/2020   BILITOT 0.7 02/10/2020   ALKPHOS 54 02/10/2020   AST 15 02/10/2020   ALT 11 02/10/2020   PROT 7.5 02/10/2020   ALBUMIN 4.6 02/10/2020   CALCIUM 9.7 02/10/2020   GFR 118.69  02/10/2020   No results found for: CHOL No results found for: HDL No results found for: LDLCALC No results found for: TRIG No results found for: CHOLHDL No results found for: IEPP2R     Assessment & Plan:   Problem List Items Addressed This Visit   None    No orders of the defined types were placed in this encounter.   I,Allison Campbell,acting as a Neurosurgeon for [employer], NP.,have documented all relevant documentation on the behalf of Lemont Fillers, NP,as directed by  Lemont Fillers, NP while in the presence of Lemont Fillers, NP.   I, Allison Craze, NP , personally preformed the services described in this documentation.  All medical record entries made by the scribe were at my direction and in my presence.  I have reviewed the chart and discharge instructions (if applicable) and agree that the record reflects my personal performance and is accurate and complete. 02/17/2021

## 2021-03-23 ENCOUNTER — Encounter: Payer: BC Managed Care – PPO | Admitting: Family Medicine

## 2021-03-24 ENCOUNTER — Encounter: Payer: BC Managed Care – PPO | Admitting: Family Medicine

## 2021-03-26 ENCOUNTER — Other Ambulatory Visit: Payer: Self-pay | Admitting: Family

## 2021-04-02 ENCOUNTER — Other Ambulatory Visit: Payer: BC Managed Care – PPO

## 2021-04-13 ENCOUNTER — Encounter: Payer: Self-pay | Admitting: Family Medicine

## 2021-04-13 ENCOUNTER — Encounter: Payer: BC Managed Care – PPO | Admitting: Family Medicine

## 2021-04-19 ENCOUNTER — Encounter: Payer: BC Managed Care – PPO | Admitting: Family Medicine

## 2021-04-26 ENCOUNTER — Telehealth: Payer: Self-pay | Admitting: *Deleted

## 2021-04-26 NOTE — Telephone Encounter (Signed)
Left message on machine for patient to call back.  She did not have an appt on 4/17.  Her appt is on 4/24 at 1040am.  Advise on machine that she will call us back to let us know if she can keep that one before cancelling. ?

## 2021-04-26 NOTE — Telephone Encounter (Signed)
04/25/21 1101pm ? ?Caller has an appt at 1040am and she will need cancel and reschedule for May.  ? ?Please cancel her appt for 4/17 at 1040am.  She will call back to reschedule. ?

## 2021-05-03 ENCOUNTER — Encounter: Payer: Self-pay | Admitting: Family Medicine

## 2021-05-03 ENCOUNTER — Ambulatory Visit (INDEPENDENT_AMBULATORY_CARE_PROVIDER_SITE_OTHER): Payer: BC Managed Care – PPO | Admitting: Family Medicine

## 2021-05-03 VITALS — BP 98/56 | HR 69 | Ht 69.0 in | Wt 172.8 lb

## 2021-05-03 DIAGNOSIS — E559 Vitamin D deficiency, unspecified: Secondary | ICD-10-CM | POA: Diagnosis not present

## 2021-05-03 DIAGNOSIS — R5383 Other fatigue: Secondary | ICD-10-CM | POA: Diagnosis not present

## 2021-05-03 DIAGNOSIS — Z0001 Encounter for general adult medical examination with abnormal findings: Secondary | ICD-10-CM | POA: Diagnosis not present

## 2021-05-03 LAB — VITAMIN D 25 HYDROXY (VIT D DEFICIENCY, FRACTURES): VITD: 26.57 ng/mL — ABNORMAL LOW (ref 30.00–100.00)

## 2021-05-03 LAB — LIPID PANEL
Cholesterol: 158 mg/dL (ref 0–200)
HDL: 52.4 mg/dL (ref >39.00)
LDL Cholesterol: 93 mg/dL (ref 0–99)
NonHDL: 105.23
Total CHOL/HDL Ratio: 3
Triglycerides: 63 mg/dL (ref 0.0–149.0)
VLDL: 12.6 mg/dL (ref 0.0–40.0)

## 2021-05-03 LAB — COMPREHENSIVE METABOLIC PANEL
ALT: 12 U/L (ref 0–35)
AST: 19 U/L (ref 0–37)
Albumin: 4.7 g/dL (ref 3.5–5.2)
Alkaline Phosphatase: 63 U/L (ref 39–117)
BUN: 11 mg/dL (ref 6–23)
CO2: 28 mEq/L (ref 19–32)
Calcium: 9.2 mg/dL (ref 8.4–10.5)
Chloride: 103 mEq/L (ref 96–112)
Creatinine, Ser: 0.8 mg/dL (ref 0.40–1.20)
GFR: 105.43 mL/min (ref >60.00)
Glucose, Bld: 73 mg/dL (ref 70–99)
Potassium: 3.9 mEq/L (ref 3.5–5.1)
Sodium: 138 mEq/L (ref 135–145)
Total Bilirubin: 0.5 mg/dL (ref 0.2–1.2)
Total Protein: 7.4 g/dL (ref 6.0–8.3)

## 2021-05-03 LAB — VITAMIN B12: Vitamin B-12: 290 pg/mL (ref 211–911)

## 2021-05-03 LAB — CBC
HCT: 34.6 % — ABNORMAL LOW (ref 36.0–46.0)
Hemoglobin: 11.6 g/dL — ABNORMAL LOW (ref 12.0–15.0)
MCHC: 33.6 g/dL (ref 30.0–36.0)
MCV: 88.8 fl (ref 78.0–100.0)
Platelets: 236 10*3/uL (ref 150.0–400.0)
RBC: 3.9 Mil/uL (ref 3.87–5.11)
RDW: 12.6 % (ref 11.5–15.5)
WBC: 6.7 10*3/uL (ref 4.0–10.5)

## 2021-05-03 LAB — TSH: TSH: 1.69 u[IU]/mL (ref 0.35–5.50)

## 2021-05-03 NOTE — Progress Notes (Signed)
? ?Complete physical exam ? ?Patient: Allison Campbell   DOB: 2000/06/21   21 y.o. Female  MRN: [id number] ? ?Subjective:  ?  ?CC: physical, fatigue ? ? ?Mally Gavina is a 21 y.o. female who presents today for a complete physical exam. She reports consuming a general and minimal pork/beef  diet. Home exercise routine includes walking 1 hrs per day. She generally feels well. She reports sleeping fairly well. She does have additional problems to discuss today. Of note, she recently started taking magnesium supplement occasionally at bedtime to help her sleep - no major concerns. She has been having some frequently fatigue - admits to not getting enough sleep and not eating regular meals a day. Thinks she is getting anywhere from 4-7 hours of sleep a night, but knows that she needs closer to 9 hours. Feels that most of her fatigue is from inadequate sleep, but would like routine labs today as well.  ? ? ? ? ? ? ?Most recent fall risk assessment: ? ?  05/03/2021  ? 10:43 AM  ?Fall Risk   ?Falls in the past year? 0  ?Number falls in past yr: 0  ?Injury with Fall? 0  ?Risk for fall due to : No Fall Risks  ?Follow up Falls evaluation completed  ? ?  ?Most recent depression screenings: ? ?  05/03/2021  ? 10:43 AM 02/10/2020  ?  1:30 PM  ?PHQ 2/9 Scores  ?PHQ - 2 Score 1 0  ? ? ?Vision:Within last year, Dental: No current dental problems and Receives regular dental care, and STD: no history, no concerns or need for testing ? ?Patient Active Problem List  ? Diagnosis Date Noted  ? Vitamin D deficiency 12/30/2020  ? Anemia 12/29/2020  ? Encounter for contraceptive management 12/29/2020  ? Tinea versicolor 02/10/2020  ? Preventative health care 02/10/2020  ? Enlarged thyroid 02/10/2020  ? Atypical chest pain 02/10/2020  ? Mild intermittent asthma 07/06/2011  ? ?Past Medical History:  ?Diagnosis Date  ? Asthma   ? cough variant worse with infection  ? Mild intermittent asthma 07/06/2011  ? Tinea versicolor 02/10/2020  ? ?  ? ?Patient  Care Team: ?Mosie Lukes, MD as PCP - General (Family Medicine)  ? ?Outpatient Medications Prior to Visit  ?Medication Sig  ? Ferrous Sulfate (IRON PO) Take by mouth daily.  ? MAGNESIUM PO Take by mouth at bedtime.  ? VITAMIN D PO Take 5,000 Int'l Units by mouth daily.  ? albuterol (PROVENTIL HFA;VENTOLIN HFA) 108 (90 Base) MCG/ACT inhaler Inhale 2 puffs into the lungs every 6 (six) hours as needed for wheezing or shortness of breath. Use 20-30 mins prior to exercise  ? cetirizine-pseudoephedrine (ZYRTEC-D) 5-120 MG tablet Take 1 tablet by mouth daily.  ? KETOCONAZOLE, TOPICAL, 1 % SHAM Wash twice weekly as needed  ? montelukast (SINGULAIR) 10 MG tablet TAKE 1 TABLET BY MOUTH EVERYDAY AT BEDTIME  ? [DISCONTINUED] norethindrone-ethinyl estradiol-FE (LOESTRIN FE) 1-20 MG-MCG tablet Take 1 tablet by mouth daily.  ? [DISCONTINUED] Vitamin D, Ergocalciferol, (DRISDOL) 1.25 MG (50000 UNIT) CAPS capsule Take 1 capsule (50,000 Units total) by mouth every 7 (seven) days.  ? ?No facility-administered medications prior to visit.  ? ? ?ROS ?All review of systems negative except what is listed in the HPI ? ? ? ? ?   ?Objective:  ? ?  ?BP (!) 98/56   Pulse 69   Ht _0  (1.753 m)   Wt 172 lb 12.8 oz (78.4 kg)  LMP 04/26/2021   BMI 25.52 kg/m?  ? ? ?Physical Exam ?Vitals reviewed.  ?Constitutional:   ?   Appearance: Normal appearance.  ?HENT:  ?   Head: Normocephalic and atraumatic.  ?   Right Ear: Tympanic membrane normal.  ?   Left Ear: Tympanic membrane normal.  ?   Nose: Nose normal. No congestion or rhinorrhea.  ?   Mouth/Throat:  ?   Mouth: Mucous membranes are moist.  ?   Pharynx: Oropharynx is clear.  ?Eyes:  ?   Extraocular Movements: Extraocular movements intact.  ?   Conjunctiva/sclera: Conjunctivae normal.  ?   Pupils: Pupils are equal, round, and reactive to light.  ?Cardiovascular:  ?   Rate and Rhythm: Normal rate and regular rhythm.  ?   Pulses: Normal pulses.  ?   Heart sounds: Normal heart sounds.   ?Pulmonary:  ?   Effort: Pulmonary effort is normal.  ?   Breath sounds: Normal breath sounds.  ?Abdominal:  ?   General: Abdomen is flat. Bowel sounds are normal. There is no distension.  ?   Palpations: Abdomen is soft. There is no mass.  ?   Tenderness: There is no abdominal tenderness. There is no right CVA tenderness, left CVA tenderness, guarding or rebound.  ?Musculoskeletal:     ?   General: Normal range of motion.  ?   Cervical back: Normal range of motion and neck supple. No tenderness.  ?Lymphadenopathy:  ?   Cervical: No cervical adenopathy.  ?Skin: ?   General: Skin is warm and dry.  ?   Capillary Refill: Capillary refill takes less than 2 seconds.  ?Neurological:  ?   General: No focal deficit present.  ?   Mental Status: She is alert and oriented to person, place, and time. Mental status is at baseline.  ?   Motor: No weakness.  ?Psychiatric:     ?   Mood and Affect: Mood normal.     ?   Behavior: Behavior normal.     ?   Thought Content: Thought content normal.     ?   Judgment: Judgment normal.  ?  ? ? ? ? ?No results found for any visits on 05/03/21. ? ?   ?Assessment & Plan:  ?  ?Routine Health Maintenance and Physical Exam ? ?Immunization History  ?Administered Date(s) Administered  ? DTaP 03/29/2000, 09/12/2000, 09/03/2001, 04/22/2004  ? HPV Quadrivalent 02/10/2020  ? HiB (PRP-OMP) 03/29/2000, 10/09/2000, 08/29/2001, 04/22/2004  ? IPV 02/25/2000, 04/21/2000, 04/22/2004  ? MMR 09/03/2001, 04/22/2004  ? PFIZER(Purple Top)SARS-COV-2 Vaccination 08/22/2019, 09/13/2019  ? Tdap 07/06/2011  ? Varicella 09/03/2001  ? ? ?Health Maintenance  ?Topic Date Due  ? HIV Screening  Never done  ? Hepatitis C Screening  Never done  ? COVID-19 Vaccine (3 - Booster for Pfizer series) 11/08/2019  ? HPV VACCINES (2 - 3-dose series) 03/09/2020  ? PAP-Cervical Cytology Screening  Never done  ? PAP SMEAR-Modifier  Never done  ? TETANUS/TDAP  07/05/2021  ? INFLUENZA VACCINE  08/10/2021  ? ? ?Discussed health benefits of  physical activity, and encouraged her to engage in regular exercise appropriate for her age and condition. ? ?Problem List Items Addressed This Visit   ? ?  ? Other  ? Vitamin D deficiency  ? Relevant Orders  ? VITAMIN D 25 Hydroxy (Vit-D Deficiency, Fractures)  ? ?Other Visit Diagnoses   ? ? Other fatigue    -  Primary  ? Relevant Orders  ?  CBC  ? TSH  ? B12  ? Encounter for routine adult health examination with abnormal findings      ? Relevant Orders  ? CBC  ? Comprehensive metabolic panel  ? Lipid panel  ? TSH  ? VITAMIN D 25 Hydroxy (Vit-D Deficiency, Fractures)  ? B12  ? ?  ? ?Fatigue/insomnia - discussed sleep hygiene (handout provided). Getting basic labs to rule out potential causes. Recommend trying OTC melatonin as well. Follow-up if not improving.  ? ? ?Return in about 1 year (around 05/04/2022) for CPE. Or sooner pending labs. ? ?  ? ?Terrilyn Saver, NP ? ? ?

## 2021-05-04 ENCOUNTER — Telehealth: Payer: Self-pay | Admitting: Family

## 2021-05-04 MED ORDER — VITAMIN D (ERGOCALCIFEROL) 1.25 MG (50000 UNIT) PO CAPS: 50000.0000 [IU] | ORAL_CAPSULE | ORAL | 0 refills | Status: DC

## 2021-05-04 NOTE — Telephone Encounter (Signed)
Pt notified of results and recommendations.

## 2021-05-04 NOTE — Telephone Encounter (Signed)
Vitamin D level is low.  Advise patient to begin vit D 50000 units once weekly for 12 weeks, then repeat vit D level (dx Vit D deficiency).     

## 2021-06-09 NOTE — Progress Notes (Unsigned)
Subjective:    Patient ID: Allison Campbell, female    DOB: [birth year], 21 y.o.   MRN: [id number]  No chief complaint on file.   HPI Patient is in today for a follow up.  Past Medical History:  Diagnosis Date   Asthma    cough variant worse with infection   Mild intermittent asthma 07/06/2011   Tinea versicolor 02/10/2020    Past Surgical History:  Procedure Laterality Date   WISDOM TOOTH EXTRACTION      Family History  Problem Relation Age of Onset   Diabetes Maternal Grandfather    Stroke Paternal Grandmother    Edema Paternal Grandmother    Heart disease Paternal Grandfather    Thyroid disease Mother        thyroid cancer   Hypothyroidism Mother    Hypertension Father    Hypothyroidism Maternal Grandmother    Asthma Maternal Grandmother    Cancer Neg Hx     Social History   Socioeconomic History   Marital status: Single    Spouse name: Not on file   Number of children: Not on file   Years of education: Not on file   Highest education level: Not on file  Occupational History   Not on file  Tobacco Use   Smoking status: Never   Smokeless tobacco: Never  Vaping Use   Vaping Use: Never used  Substance and Sexual Activity   Alcohol use: No   Drug use: No   Sexual activity: Not on file  Other Topics Concern   Not on file  Social History Narrative   Lives with parents, along with 2 nephews.  2 siblings ([place]).  No passive tobacco exposure.   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 Use 20-30 mins prior to exercise 18 g 0   cetirizine-pseudoephedrine (ZYRTEC-D) 5-120 MG tablet Take 1 tablet by mouth daily. 15 tablet 0   Ferrous Sulfate (IRON PO) Take by mouth daily.     KETOCONAZOLE, TOPICAL, 1 % SHAM Wash twice weekly as needed 200 mL 5   MAGNESIUM PO Take by mouth at bedtime.     montelukast (SINGULAIR) 10 MG tablet TAKE 1 TABLET BY MOUTH EVERYDAY AT BEDTIME 90 tablet 1   VITAMIN D PO Take 5,000 Int'l Units by mouth daily.     Vitamin D, Ergocalciferol, (DRISDOL) 1.25 MG (50000 UNIT) CAPS capsule Take 1 capsule (50,000 Units total) by mouth every 7 (seven) days. 12 capsule 0   No facility-administered medications prior to visit.    Allergies  Allergen Reactions   Amoxicillin    Penicillins Hives    ROS     Objective:    Physical Exam  There were no vitals taken for this visit. Wt Readings from Last 3 Encounters:  05/03/21 172 lb 12.8 oz (78.4 kg)  12/29/20 177 lb 9.6  oz (80.6 kg)  12/23/20 177 lb (80.3 kg)    Diabetic Foot Exam - Simple   No data filed    Lab Results  Component Value Date   WBC 6.7 05/03/2021   HGB 11.6 (L) 05/03/2021   HCT 34.6 (L) 05/03/2021   PLT 236.0 05/03/2021   GLUCOSE 73 05/03/2021   CHOL 158 05/03/2021   TRIG 63.0 05/03/2021   HDL 52.40 05/03/2021   LDLCALC 93 05/03/2021   ALT 12 05/03/2021   AST 19 05/03/2021   NA 138 05/03/2021   K 3.9 05/03/2021   CL 103 05/03/2021   CREATININE 0.80 05/03/2021   BUN 11 05/03/2021   CO2 28 05/03/2021   TSH 1.69 05/03/2021    Lab Results  Component Value Date   TSH 1.69 05/03/2021   Lab Results  Component Value Date   WBC 6.7 05/03/2021   HGB 11.6 (L) 05/03/2021   HCT 34.6 (L) 05/03/2021   MCV 88.8 05/03/2021   PLT 236.0 05/03/2021   Lab Results  Component Value Date   NA 138 05/03/2021   K 3.9 05/03/2021   CO2 28 05/03/2021   GLUCOSE  73 05/03/2021   BUN 11 05/03/2021   CREATININE 0.80 05/03/2021   BILITOT 0.5 05/03/2021   ALKPHOS 63 05/03/2021   AST 19 05/03/2021   ALT 12 05/03/2021   PROT 7.4 05/03/2021   ALBUMIN 4.7 05/03/2021   CALCIUM 9.2 05/03/2021   GFR 105.43 05/03/2021   Lab Results  Component Value Date   CHOL 158 05/03/2021   Lab Results  Component Value Date   HDL 52.40 05/03/2021   Lab Results  Component Value Date   LDLCALC 93 05/03/2021   Lab Results  Component Value Date   TRIG 63.0 05/03/2021   Lab Results  Component Value Date   CHOLHDL 3 05/03/2021   No results found for: HGBA1C     Assessment & Plan:   Problem List Items Addressed This Visit   None   I am having Eloise Levels maintain her cetirizine-pseudoephedrine, albuterol, KETOCONAZOLE (TOPICAL), montelukast, VITAMIN D PO, MAGNESIUM PO, Ferrous Sulfate (IRON PO), and Vitamin D (Ergocalciferol).  No orders of the defined types were placed in this encounter.

## 2021-06-10 ENCOUNTER — Encounter: Payer: Self-pay | Admitting: Family Medicine

## 2021-06-10 ENCOUNTER — Ambulatory Visit: Payer: BC Managed Care – PPO | Admitting: Family Medicine

## 2021-06-10 VITALS — BP 116/80 | HR 64 | Resp 20 | Ht 69.0 in | Wt 176.0 lb

## 2021-06-10 DIAGNOSIS — F4310 Post-traumatic stress disorder, unspecified: Secondary | ICD-10-CM | POA: Diagnosis not present

## 2021-06-10 DIAGNOSIS — Z0000 Encounter for general adult medical examination without abnormal findings: Secondary | ICD-10-CM

## 2021-06-10 DIAGNOSIS — R5383 Other fatigue: Secondary | ICD-10-CM | POA: Diagnosis not present

## 2021-06-10 DIAGNOSIS — Z01419 Encounter for gynecological examination (general) (routine) without abnormal findings: Secondary | ICD-10-CM

## 2021-06-10 DIAGNOSIS — J4520 Mild intermittent asthma, uncomplicated: Secondary | ICD-10-CM

## 2021-06-10 MED ORDER — ALPRAZOLAM 0.25 MG PO TABS: 0.2500 mg | ORAL_TABLET | Freq: Two times a day (BID) | ORAL | 0 refills | Status: DC | PRN

## 2021-06-10 NOTE — Patient Instructions (Signed)
Managing Post-Traumatic Stress Disorder If you have been diagnosed with post-traumatic stress disorder (PTSD), you may be relieved that you now know why you have felt or behaved a certain way. Still, you may feel overwhelmed about the treatment ahead. You may also wonder how to get the support you need and how to deal with the condition day-to-day. If you are living with PTSD, there are ways to help you recover from it and manage your symptoms. How to manage lifestyle changes Managing stress Stress is your body's reaction to life changes and events, both good and bad. Stress can make PTSD worse. Take the following steps to manage stress: Talk with your health care provider or a counselor if you would like to learn more about techniques to reduce your stress. He or she may suggest some stress reduction techniques such as: Muscle relaxation exercises. Regular exercise. Meditation, yoga, or other mind-body exercises. Breathing exercises. Listening to quiet music. Spending time outside. Maintain a healthy lifestyle. Eat a healthy diet, exercise regularly, get plenty of sleep, and take time to relax. Spend time with others. Talk with them about how you are feeling and what kind of support you need. Try not to isolate yourself, even though you may feel like doing that. Isolating yourself can delay your recovery. Do activities and hobbies that you enjoy. Pace yourself when doing stressful things. Take breaks, and reward yourself when you finish. Make sure that you do not overload your schedule.  Medicines Your health care provider may suggest certain medicines if he or she feels that they will help to improve your condition. Medicines for depression (antidepressants) or severe loss of contact with reality (antipsychotics) may be used to treat PTSD. Avoid using alcohol and other substances that may prevent your medicines from working properly. It is also important to: Talk with your pharmacist or health  care provider about all medicines that you take, their possible side effects, and which medicines are safe to take together. Make it your goal to take part in all treatment decisions (shared decision-making). Ask about possible side effects of medicines that your health care provider recommends, and tell him or her how you feel about having those side effects. It is best to have shared decision-making with your health care provider as part of your total treatment plan. If your health care provider prescribes a medicine, you may not notice the full benefits of it for 4-8 weeks. Most people who are treated for PTSD need to take medicine for at least 6-12 months before they feel better. If you are taking medicines as part of your treatment, do not stop taking medicines before you ask your health care provider if it is safe to stop. You may need to have the medicine slowly decreased (tapered) over time to lower the risk of harmful side effects. Relationships Many people who have PTSD have difficulty trusting others. Make an effort to: Take risks and develop trust with close friends and family members. Developing trust in others can help you feel safe and connect you with emotional support. Be open and honest about your feelings. Have fun and relax in safe spaces, such as with friends and family. Think about going to couples counseling, family education classes, or family therapy. Your family members may not always know how to be supportive. Therapy can be helpful for everyone. How to recognize changes in your condition Be aware of your symptoms and how often you have them. The following symptoms mean that you need to get   help for your PTSD: You feel suspicious and angry. You have repeated flashbacks. You avoid going out or being with others. You have an increasing number of fights with close friends or family members. You have thoughts about hurting yourself or others. You cannot get relief from  feelings of depression or anxiety. Follow these instructions at home: Lifestyle Exercise regularly. Try to do 30 minutes or more of physical activity on most days of the week. Try to get 7-9 hours of sleep each night. To help with sleep: Keep your bedroom cool and dark. Avoid screen time before bedtime. This means avoiding use of your TV, computer, tablet, and mobile phone. Practice self-soothing skills and use them daily. Try to have fun and find humor in your life. Eating and drinking Do not eat a heavy meal during the hour before you go to bed. Do not drink alcohol or caffeinated drinks before bed. Avoid using alcohol or drugs. General instructions If your PTSD is affecting your marriage or family, get help from a family therapist. Remind yourself that recovering from the trauma is a process and takes time. Take over-the-counter and prescription medicines only as told by your health care provider. Make sure to let all of your health care providers know that you have PTSD. This is especially important if you are having surgery or need to be admitted to the hospital. Keep all follow-up visits. This is important. Where to find support Talking to others Explain that PTSD is a mental health problem. It is something that a person can develop after experiencing or seeing a traumatic event. Tell others that PTSD makes you feel stress like you did during the event. Talk to your family members and friends about the symptoms you have. Also, tell them what things or situations can cause symptoms to start (are triggers for you). Assure your family members that there are treatments to help PTSD. Discuss possibly getting family therapy or couples therapy. If you are worried or fearful about getting treatment, ask for support. Keep daily contact with at least one trusted friend or family member. Finances Not all insurance plans cover mental health care, so it is important to check with your insurance  carrier. If paying for co-pays or counseling services is a problem, search for a local or county mental health care center. Public mental health care services may be offered at those places at a low cost or no cost when you are not able to see a private health care provider. If you are a veteran, contact a local veterans organization or veterans hospital for more information. If you are taking medicine for PTSD, you may be able to get the genericform, which may be less expensive than brand-name medicine. Some makers of prescription medicines also offer help to people who cannot afford the medicines that they need. Therapy and support groups Find a support group in your community. Often, groups are available for military veterans, trauma victims, and family members or caregivers. Look into volunteer opportunities. Taking part in these can help you feel more connected to your community. Contact a local organization to find out if you are eligible for a service dog. Where to find more information Go to these websites to find more information about PTSD, treatment of PTSD, and how to get support: National Institute of Mental Health: www.nimh.nih.gov National Center for PTSD: www.ptsd.va.gov Contact a health care provider if: Your symptoms get worse or do not get better. You are feeling overwhelmed by your symptoms. Get help   right away if: You have thoughts about hurting yourself or others. Get help right away if you feel like you may hurt yourself or others, or have thoughts about taking your own life. Go to your nearest emergency room or: Call 911. Call the National Suicide Prevention Lifeline at 1-800-273-8255 or 988. This is open 24 hours a day. Text the Crisis Text Line at 741741. Summary If you are living with PTSD, there are ways to help you recover from it and manage your symptoms. Find supportive environments and people who understand PTSD. Spend time in those places, and maintain contact  with those people. Work with your health care team to create a plan for managing PTSD. The plan should include counseling, stress reduction techniques, and healthy lifestyle habits. This information is not intended to replace advice given to you by your health care provider. Make sure you discuss any questions you have with your health care provider. Document Revised: 10/14/2020 Document Reviewed: 11/02/2020 Elsevier Patient Education  2023 [employer] Inc.  

## 2021-06-11 DIAGNOSIS — F4310 Post-traumatic stress disorder, unspecified: Secondary | ICD-10-CM | POA: Insufficient documentation
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
## 2021-06-11 NOTE — Assessment & Plan Note (Signed)
Referred to OB/GYN for ongoing well woman care.

## 2021-06-13 ENCOUNTER — Encounter: Payer: Self-pay | Admitting: Family Medicine

## 2021-06-14 ENCOUNTER — Other Ambulatory Visit: Payer: Self-pay | Admitting: Family Medicine

## 2021-06-14 MED ORDER — WEGOVY 0.25 MG/0.5ML ~~LOC~~ SOAJ: 0.2500 mg | SUBCUTANEOUS | 1 refills | Status: DC

## 2021-06-14 NOTE — Telephone Encounter (Signed)
She is referring to a SSRI, notes didn't specify which. Thanks

## 2021-06-16 ENCOUNTER — Other Ambulatory Visit: Payer: Self-pay

## 2021-06-16 MED ORDER — SERTRALINE HCL 50 MG PO TABS: ORAL_TABLET | ORAL | 2 refills | Status: DC

## 2021-07-16 ENCOUNTER — Ambulatory Visit: Payer: BC Managed Care – PPO | Admitting: Family

## 2021-07-26 ENCOUNTER — Encounter (HOSPITAL_COMMUNITY): Payer: Self-pay | Admitting: Emergency Medicine

## 2021-07-26 ENCOUNTER — Ambulatory Visit (INDEPENDENT_AMBULATORY_CARE_PROVIDER_SITE_OTHER): Payer: BC Managed Care – PPO

## 2021-07-26 ENCOUNTER — Telehealth: Payer: Self-pay

## 2021-07-26 ENCOUNTER — Ambulatory Visit (HOSPITAL_COMMUNITY)
Admission: EM | Admit: 2021-07-26 | Discharge: 2021-07-26 | Disposition: A | Discharge status: Home / Self Care | Payer: BC Managed Care – PPO | Attending: Family Medicine | Admitting: Family Medicine

## 2021-07-26 DIAGNOSIS — J45901 Unspecified asthma with (acute) exacerbation: Secondary | ICD-10-CM | POA: Diagnosis not present

## 2021-07-26 DIAGNOSIS — R0602 Shortness of breath: Secondary | ICD-10-CM | POA: Diagnosis not present

## 2021-07-26 DIAGNOSIS — J0140 Acute pansinusitis, unspecified: Secondary | ICD-10-CM

## 2021-07-26 MED ORDER — PROMETHAZINE-DM 6.25-15 MG/5ML PO SYRP: 5.0000 mL | ORAL_SOLUTION | Freq: Three times a day (TID) | ORAL | 0 refills | Status: DC | PRN

## 2021-07-26 MED ORDER — AZITHROMYCIN 250 MG PO TABS: ORAL_TABLET | ORAL | 0 refills | Status: DC

## 2021-07-26 NOTE — ED Triage Notes (Signed)
Pt reports shortness of breath x 3 days.  States last use of inhaler was 3 hours ago.  Hx of asthma. Pt able to speak in full sentences in NAD.

## 2021-07-26 NOTE — Telephone Encounter (Signed)
Nurse Assessment Nurse: Yetta Barre, RN, Miranda Date/Time (Eastern Time): 07/26/2021 12:11:56 PM Confirm and document reason for call. If symptomatic, describe symptoms. ---Caller states she has asthma and having SOB and mild cough. Albuterol Q5-6 hrs. Also with congestion and headaches. Does the patient have any new or worsening symptoms? ---Yes Will a triage be completed? ---Yes Related visit to physician within the last 2 weeks? ---No Does the PT have any chronic conditions? (i.e. diabetes, asthma, this includes High risk factors for pregnancy, etc.) ---Yes List chronic conditions. ---Asthma, Allergies, PTSD Is the patient pregnant or possibly pregnant? (Ask all females between the ages of 58-55) ---No Is this a behavioral health or substance abuse call? ---No Guidelines Guideline Title Affirmed Question Affirmed Notes Nurse Date/Time (Eastern Time) Asthma Attack [1] MILD asthma attack (e.g., no SOB at rest, mild SOB with walking, Jones, RN, Miranda 07/26/2021 12:14:06 PM PLEASE NOTE: All timestamps contained within this report are represented as [place] Standard Time. CONFIDENTIALTY NOTICE: This fax transmission is intended only for the addressee. It contains information that is legally privileged, confidential or otherwise protected from use or disclosure. If you are not the intended recipient, you are strictly prohibited from reviewing, disclosing, copying using or disseminating any of this information or taking any action in reliance on or regarding this information. If you have received this fax in error, please notify us immediately by telephone so that we can arrange for its return to [place]. Phone: [phone], Toll-Free: [phone], Fax: [phone] Page: 2 of 2 Call Id: [id number] Guidelines Guideline Title Affirmed Question Affirmed Notes Nurse Date/Time Allison Campbell Time) speaks normally in sentences, mild wheezing) AND [2] lasting > 24 hours on prescribed treatment Disp.  Time Allison Campbell Time) Disposition Final User 07/26/2021 12:09:37 PM Send to Urgent Geanie Logan 07/26/2021 12:24:33 PM See PCP within 24 Hours Yes Yetta Barre RN, Miranda Final Disposition 07/26/2021 12:24:33 PM See PCP within 24 Hours Yes Yetta Barre, RN, Lenetta Quaker Disagree/Comply Comply Caller Understands Yes PreDisposition Call Doctor Care Advice Given Per Guideline SEE PCP WITHIN 24 HOURS: * IF OFFICE WILL BE OPEN: You need to be examined within the next 24 hours. Call your doctor (or NP/PA) when the office opens and make an appointment. ASTHMA ATTACK - TREATMENT - QUICK-RELIEF MEDICINE: * Start your quick-relief medicine (e.g., albuterol, salbutamol) at the first sign of any coughing or shortness of breath (don't wait for wheezing). * For MILD ASTHMA SYMPTOMS use your quick-relief inhaler (2 puffs each time) or your nebulizer every 4 hours. Continue the quick-relief asthma medicine until you have not wheezed or coughed for 48 hours. It takes a minimum of 7 days of medicine for lung function to return to normal. * During an ASTHMA ATTACK use your SHORT-ACTING QUICK-RELIEF INHALER (4 puffs, such as ALBUTEROL) or nebulizer up to 3 times, every 20 minutes as needed. DRINK PLENTY OF LIQUIDS AND USE A HUMIDIFIER: * Drink plenty of liquids. It is important to stay well-hydrated. * If the air is dry, use a humidifier to prevent drying of the upper airway. * A healthy adult should drink 8 cups or more of liquid each day. One cup equals 8 oz (240 ml). HAY FEVER AND ANTIHISTAMINE MEDICINES: * If you have symptoms from hay fever, it's OK to take antihistamines. * Reason: Poor control of allergic rhinitis makes asthma worse, but antihistamines don't make asthma worse. AVOID ASTHMA TRIGGERS: * Avoid triggers that you know cause your asthma attacks. Some common asthma attack triggers are: * ... Air pollution CALL BACK IF: *  Quick-relief asthma medicine (such as albuterol by inhaler or nebulizer) is needed  more often than every 4 hours * You become worse CARE ADVICE given per Asthma Attack (Adult) guideline. Referrals GO TO FACILITY OTHER - SPECIFY

## 2021-07-26 NOTE — ED Provider Notes (Signed)
[facility]    CSN: [id number] Arrival date & time: 07/26/21  1314      History   Chief Complaint Chief Complaint  Patient presents with   Shortness of Breath    HPI Allison Campbell is a 21 y.o. female.   HPI Patient with a history of asthma presents today with acute shortness of breath, times today.  She reports symptoms consistent with a upper respiratory illness has been present for more than 4 days in the asthma symptoms developed at the same time.  She is having severe nasal congestion, coughing which is occasionally productive of mucus, runny nose, and facial pressure from congestion.  She also complains of bilateral ear pressure without pain.  She is not having a sore throat.  No known fever.  She takes her Zyrtec and Singulair when allergy season is present and has been taking recently.  She is unaware of any known sick contacts Past Medical History:  Diagnosis Date   Asthma    cough variant worse with infection   Mild intermittent asthma 07/06/2011   Tinea versicolor 02/10/2020    Patient Active Problem List   Diagnosis Date Noted   PTSD (post-traumatic stress disorder) 06/11/2021   Vitamin D deficiency 12/30/2020   Anemia 12/29/2020   Encounter for contraceptive management 12/29/2020   Tinea versicolor 02/10/2020   Preventative health care 02/10/2020   Enlarged thyroid 02/10/2020   Atypical chest pain 02/10/2020   Mild intermittent asthma 07/06/2011    Past Surgical History:  Procedure Laterality Date   WISDOM TOOTH EXTRACTION      OB History   No obstetric history on file.      Home Medications    Prior to Admission medications   Medication Sig Start Date End Date Taking? Authorizing Provider  azithromycin (ZITHROMAX) 250 MG tablet Take 2 tabs PO x 1 dose, then 1 tab PO QD x 4 days 07/26/21  Yes Bing Neighbors, FNP  promethazine-dextromethorphan (PROMETHAZINE-DM) 6.25-15 MG/5ML syrup Take 5 mLs by mouth 3 (three) times daily as needed for  cough. 07/26/21  Yes Bing Neighbors, FNP  albuterol (PROVENTIL HFA;VENTOLIN HFA) 108 (90 Base) MCG/ACT inhaler Inhale 2 puffs into the lungs every 6 (six) hours as needed for wheezing or shortness of breath. Use 20-30 mins prior to exercise 12/17/16   Cathie Hoops, Amy V, PA-C  ALPRAZolam Prudy Feeler) 0.25 MG tablet Take 1 tablet (0.25 mg total) by mouth 2 (two) times daily as needed for anxiety. 06/10/21   Bradd Canary, MD  cetirizine-pseudoephedrine (ZYRTEC-D) 5-120 MG tablet Take 1 tablet by mouth daily. 12/17/16   Cathie Hoops, Amy V, PA-C  Ferrous Sulfate (IRON PO) Take by mouth daily.    [provider]  KETOCONAZOLE, TOPICAL, 1 % SHAM Wash twice weekly as needed 02/10/20   Bradd Canary, MD  MAGNESIUM PO Take by mouth at bedtime.    [provider]  montelukast (SINGULAIR) 10 MG tablet TAKE 1 TABLET BY MOUTH EVERYDAY AT BEDTIME 03/26/21   [name], NP  sertraline (ZOLOFT) 50 MG tablet Take 0.5 tablets (25 mg total) by mouth daily for 7 days, THEN 1 tablet (50 mg total) daily. 06/16/21 07/23/21  Bradd Canary, MD  VITAMIN D PO Take 5,000 Int'l Units by mouth daily.    [provider]  Vitamin D, Ergocalciferol, (DRISDOL) 1.25 MG (50000 UNIT) CAPS capsule Take 1 capsule (50,000 Units total) by mouth every 7 (seven) days. 05/04/21   [name], NP    Family  History Family History  Problem Relation Age of Onset   Diabetes Maternal Grandfather    Stroke Paternal Grandmother    Edema Paternal Grandmother    Heart disease Paternal Grandfather    Thyroid disease Mother        thyroid cancer   Hypothyroidism Mother    Hypertension Father    Hypothyroidism Maternal Grandmother    Asthma Maternal Grandmother    Cancer Neg Hx     Social History Social History   Tobacco Use   Smoking status: Never   Smokeless tobacco: Never  Vaping Use   Vaping Use: Never used  Substance Use Topics   Alcohol use: No   Drug use: No     Allergies   Amoxicillin and  Penicillins   Review of Systems Review of Systems  Pertinent negatives listed in HPI   Physical Exam Triage Vital Signs ED Triage Vitals  Enc Vitals Group     BP 07/26/21 1529 117/84     Pulse Rate 07/26/21 1529 63     Resp 07/26/21 1529 18     Temp 07/26/21 1529 98.7 F (37.1 C)     Temp Source 07/26/21 1529 Oral     SpO2 07/26/21 1529 98 %     Weight --      Height --      Head Circumference --      Peak Flow --      Pain Score 07/26/21 1527 4     Pain Loc --      Pain Edu? --      Excl. in GC? --    No data found.  Updated Vital Signs BP 117/84 (BP Location: Right Arm)   Pulse 63   Temp 98.7 F (37.1 C) (Oral)   Resp 18   SpO2 98%   Visual Acuity Right Eye Distance:   Left Eye Distance:   Bilateral Distance:    Right Eye Near:   Left Eye Near:    Bilateral Near:     Physical Exam  General Appearance:    Alert, cooperative, no distress  HENT:   Normocephalic, ears normal, nares mucosal edema with congestion, rhinorrhea, oropharynx patent-clear- moist  Eyes:    PERRL, conjunctiva/corneas clear, EOM's intact       Lungs:     Clear to auscultation bilaterally, respirations unlabored  Heart:    Regular rate and rhythm  Neurologic:   Awake, alert, oriented x 3. No apparent focal neurological           defect.     UC Treatments / Results  Labs (all labs ordered are listed, but only abnormal results are displayed) Labs Reviewed - No data to display  EKG   Radiology DG Chest 2 View  Result Date: 07/26/2021 CLINICAL DATA:  21 year old female presenting for evaluation of shortness of breath, history of the active airway disease. EXAM: CHEST - 2 VIEW COMPARISON:  December 17, 2020. FINDINGS: Trachea midline. Cardiomediastinal contours and hilar structures are stable. Lungs are clear. No sign of effusion. No pneumothorax or consolidation. On limited assessment no acute skeletal findings. IMPRESSION: No acute cardiopulmonary disease. Electronically Signed   By:  Donzetta Kohut M.D.   On: 07/26/2021 15:49    Procedures Procedures (including critical care time)  Medications Ordered in UC Medications - No data to display  Initial Impression / Assessment and Plan / UC Course  I have reviewed the triage vital signs and the nursing notes.  Pertinent labs & imaging  results that were available during my care of the patient were reviewed by me and considered in my medical decision making (see chart for details).    Acute asthma exacerbation and non-recurrent pansinusitis Treatment with azithromycin given patient has a penicillin allergy. Promethazine DM for cough. Encourage patient to consistently use her albuterol inhaler to relieve asthma symptoms and prevent worsening of shortness of breath and chest tightness.  She is to follow-up with her primary care provider if her symptoms worsen or do not readily improve. Final Clinical Impressions(s) / UC Diagnoses   Final diagnoses:  Exacerbation of asthma, unspecified asthma severity, unspecified whether persistent  Acute non-recurrent pansinusitis     Discharge Instructions      Continue albuterol inhaler 2 puffs every 6 hours as needed for wheezing, chest heaviness or shortness of breath.  Given you are currently having some acute symptoms which are worsening at bedtime I would recommend using your inhaler at least an hour to 2 hours before laying down to prevent chest heaviness and tightness at bedtime.  Treating you for a sinus infection which I feel is triggering your current symptoms.  Also prescribing Promethazine DM for cough.  Follow-up with your primary care provider if symptoms worsen or do not improve.     ED Prescriptions     Medication Sig Dispense Auth. Provider   azithromycin (ZITHROMAX) 250 MG tablet Take 2 tabs PO x 1 dose, then 1 tab PO QD x 4 days 6 tablet Bing Neighbors, FNP   promethazine-dextromethorphan (PROMETHAZINE-DM) 6.25-15 MG/5ML syrup Take 5 mLs by mouth 3 (three)  times daily as needed for cough. 140 mL Bing Neighbors, FNP      PDMP not reviewed this encounter.   Bing Neighbors, FNP 07/26/21 1610

## 2021-07-26 NOTE — Discharge Instructions (Signed)
Continue albuterol inhaler 2 puffs every 6 hours as needed for wheezing, chest heaviness or shortness of breath.  Given you are currently having some acute symptoms which are worsening at bedtime I would recommend using your inhaler at least an hour to 2 hours before laying down to prevent chest heaviness and tightness at bedtime.  Treating you for a sinus infection which I feel is triggering your current symptoms.  Also prescribing Promethazine DM for cough.  Follow-up with your primary care provider if symptoms worsen or do not improve.

## 2021-07-26 NOTE — Telephone Encounter (Signed)
Pt at urgent care.  

## 2021-07-27 ENCOUNTER — Encounter: Payer: Self-pay | Admitting: Obstetrics & Gynecology

## 2021-07-27 ENCOUNTER — Ambulatory Visit (INDEPENDENT_AMBULATORY_CARE_PROVIDER_SITE_OTHER): Payer: BC Managed Care – PPO | Admitting: Obstetrics & Gynecology

## 2021-07-27 ENCOUNTER — Other Ambulatory Visit (HOSPITAL_COMMUNITY)
Admission: RE | Admit: 2021-07-27 | Discharge: 2021-07-27 | Disposition: A | Discharge status: Home / Self Care | Payer: BC Managed Care – PPO | Source: Ambulatory Visit | Attending: Obstetrics & Gynecology | Admitting: Obstetrics & Gynecology

## 2021-07-27 VITALS — BP 120/75 | HR 61 | Wt 177.2 lb

## 2021-07-27 DIAGNOSIS — Z113 Encounter for screening for infections with a predominantly sexual mode of transmission: Secondary | ICD-10-CM

## 2021-07-27 DIAGNOSIS — Z3041 Encounter for surveillance of contraceptive pills: Secondary | ICD-10-CM

## 2021-07-27 DIAGNOSIS — Z7185 Encounter for immunization safety counseling: Secondary | ICD-10-CM | POA: Diagnosis not present

## 2021-07-27 DIAGNOSIS — Z01419 Encounter for gynecological examination (general) (routine) without abnormal findings: Secondary | ICD-10-CM | POA: Insufficient documentation

## 2021-07-27 MED ORDER — DROSPIRENONE-ETHINYL ESTRADIOL 3-0.02 MG PO TABS: 1.0000 | ORAL_TABLET | Freq: Every day | ORAL | 11 refills | Status: DC

## 2021-07-27 NOTE — Progress Notes (Signed)
GYNECOLOGY ANNUAL PREVENTATIVE CARE ENCOUNTER NOTE  History:     Allison Campbell is a 21 y.o. G0 female here for a routine annual gynecologic exam.  Current complaints: none.  History of PMS, was on Junel in the past, wants to restart OCPs.  Sexually active with one female partner, uses condoms 100% of the time. Desires annual STI screen.  Denies abnormal vaginal bleeding, discharge, pelvic pain, problems with intercourse or other gynecologic concerns.    Gynecologic History Patient's last menstrual period was 07/03/2021 (exact date). Contraception: condoms Last Pap: Never had one. Had one dose of HPV vaccine.  Obstetric History OB History  Gravida Para Term Preterm AB Living  0 0 0 0 0 0  SAB IAB Ectopic Multiple Live Births  0 0 0 0 0    Past Medical History:  Diagnosis Date   Asthma    cough variant worse with infection   Atypical chest pain 02/10/2020   Mild intermittent asthma 07/06/2011   Tinea versicolor 02/10/2020    Past Surgical History:  Procedure Laterality Date   WISDOM TOOTH EXTRACTION      Current Outpatient Medications on File Prior to Visit  Medication Sig Dispense Refill   albuterol (PROVENTIL HFA;VENTOLIN HFA) 108 (90 Base) MCG/ACT inhaler Inhale 2 puffs into the lungs every 6 (six) hours as needed for wheezing or shortness of breath. Use 20-30 mins prior to exercise 18 g 0   KETOCONAZOLE, TOPICAL, 1 % SHAM Wash twice weekly as needed 200 mL 5   montelukast (SINGULAIR) 10 MG tablet TAKE 1 TABLET BY MOUTH EVERYDAY AT BEDTIME 90 tablet 1   Vitamin D, Ergocalciferol, (DRISDOL) 1.25 MG (50000 UNIT) CAPS capsule Take 1 capsule (50,000 Units total) by mouth every 7 (seven) days. 12 capsule 0   ALPRAZolam (XANAX) 0.25 MG tablet Take 1 tablet (0.25 mg total) by mouth 2 (two) times daily as needed for anxiety. (Patient not taking: Reported on 07/27/2021) 20 tablet 0   azithromycin (ZITHROMAX) 250 MG tablet Take 2 tabs PO x 1 dose, then 1 tab PO QD x 4 days (Patient not  taking: Reported on 07/27/2021) 6 tablet 0   cetirizine-pseudoephedrine (ZYRTEC-D) 5-120 MG tablet Take 1 tablet by mouth daily. (Patient not taking: Reported on 07/27/2021) 15 tablet 0   Ferrous Sulfate (IRON PO) Take by mouth daily. (Patient not taking: Reported on 07/27/2021)     MAGNESIUM PO Take by mouth at bedtime. (Patient not taking: Reported on 07/27/2021)     promethazine-dextromethorphan (PROMETHAZINE-DM) 6.25-15 MG/5ML syrup Take 5 mLs by mouth 3 (three) times daily as needed for cough. 140 mL 0   sertraline (ZOLOFT) 50 MG tablet Take 0.5 tablets (25 mg total) by mouth daily for 7 days, THEN 1 tablet (50 mg total) daily. 30 tablet 2   VITAMIN D PO Take 5,000 Int'l Units by mouth daily. (Patient not taking: Reported on 07/27/2021)     No current facility-administered medications on file prior to visit.    Allergies  Allergen Reactions   Amoxicillin    Penicillins Hives    Social History:  reports that she has never smoked. She has never used smokeless tobacco. She reports that she does not currently use alcohol. She reports that she does not use drugs.  Family History  Problem Relation Age of Onset   Diabetes Maternal Grandfather    Stroke Paternal Grandmother    Edema Paternal Grandmother    Heart disease Paternal Grandfather    Thyroid disease Mother  thyroid cancer   Hypothyroidism Mother    Hypertension Father    Hypothyroidism Maternal Grandmother    Asthma Maternal Grandmother    Cancer Neg Hx     The following portions of the patient's history were reviewed and updated as appropriate: allergies, current medications, past family history, past medical history, past social history, past surgical history and problem list.  Review of Systems Pertinent items noted in HPI and remainder of comprehensive ROS otherwise negative.  Physical Exam:  BP 120/75   Pulse 61   Wt 177 lb 3.2 oz (80.4 kg)   LMP 07/03/2021 (Exact Date)   BMI 26.17 kg/m  CONSTITUTIONAL:  Well-developed, well-nourished female in no acute distress.  HENT:  Normocephalic, atraumatic, External right and left ear normal.  EYES: Conjunctivae and EOM are normal. Pupils are equal, round, and reactive to light. No scleral icterus.  NECK: Normal range of motion, supple, no masses.  Normal thyroid.  SKIN: Skin is warm and dry. No rash noted. Not diaphoretic. No erythema. No pallor. MUSCULOSKELETAL: Normal range of motion. No tenderness.  No cyanosis, clubbing, or edema. NEUROLOGIC: Alert and oriented to person, place, and time. Normal reflexes, muscle tone coordination.  PSYCHIATRIC: Normal mood and affect. Normal behavior. Normal judgment and thought content. CARDIOVASCULAR: Normal heart rate noted, regular rhythm RESPIRATORY: Clear to auscultation bilaterally. Effort and breath sounds normal, no problems with respiration noted. BREASTS: Symmetric in size. No masses, tenderness, skin changes, nipple drainage, or lymphadenopathy bilaterally. Performed in the presence of a chaperone. ABDOMEN: Soft, no distention noted.  No tenderness, rebound or guarding.  PELVIC: Normal appearing external genitalia and urethral meatus; normal appearing vaginal mucosa and cervix.  No abnormal vaginal discharge noted.  Pap smear obtained.  Normal uterine size, no other palpable masses, no uterine or adnexal tenderness.  Performed in the presence of a chaperone.   Assessment and Plan:     1. Routine screening for STI (sexually transmitted infection) STI screen ordered, will follow up results and manage accordingly. Safe sex recommended at all times - Cytology - PAP ancillary testing for GC/Chlam/Trich - RPR+HBsAg+HCVAb+HIV  2. HPV vaccine counseling Counseled about HPV vaccine, information given to her to review. Emphasized importance of getting all three doses in six month period.   3. Oral contraceptive pill surveillance Yaz prescribed as this can also help with PMS/PMDD. Discussed possibility of doing  continuous therapy if indicated. Cautioned about severe side effects or other side effects of OCPs. Will reevaluate in 2 months. - drospirenone-ethinyl estradiol (YAZ) 3-0.02 MG tablet; Take 1 tablet by mouth daily.  Dispense: 28 tablet; Refill: 11  4. Well woman exam with routine gynecological exam - Cytology - PAP Will follow up results of pap smear and manage accordingly. Routine preventative health maintenance measures emphasized. Please refer to After Visit Summary for other counseling recommendations.      Jaynie Collins, MD, FACOG Obstetrician & Gynecologist, [facility] for [employer], [facility]

## 2021-07-28 LAB — CYTOLOGY - PAP
Chlamydia: NEGATIVE
Comment: NEGATIVE
Comment: NORMAL
Diagnosis: NEGATIVE
Neisseria Gonorrhea: NEGATIVE

## 2021-07-28 LAB — RPR+HBSAG+HCVAB+...
HIV Screen 4th Generation wRfx: NONREACTIVE
Hep C Virus Ab: NONREACTIVE
Hepatitis B Surface Ag: NEGATIVE
RPR Ser Ql: NONREACTIVE

## 2021-07-30 ENCOUNTER — Emergency Department (HOSPITAL_COMMUNITY)
Admission: EM | Admit: 2021-07-30 | Discharge: 2021-07-30 | Disposition: A | Discharge status: Home / Self Care | Payer: BC Managed Care – PPO | Attending: Emergency Medicine | Admitting: Emergency Medicine

## 2021-07-30 ENCOUNTER — Encounter (HOSPITAL_COMMUNITY): Payer: Self-pay

## 2021-07-30 ENCOUNTER — Telehealth: Payer: Self-pay | Admitting: Family Medicine

## 2021-07-30 ENCOUNTER — Other Ambulatory Visit: Payer: Self-pay

## 2021-07-30 DIAGNOSIS — R202 Paresthesia of skin: Secondary | ICD-10-CM

## 2021-07-30 LAB — BASIC METABOLIC PANEL
Anion gap: 7 (ref 5–15)
BUN: 11 mg/dL (ref 6–20)
CO2: 25 mmol/L (ref 22–32)
Calcium: 9.4 mg/dL (ref 8.9–10.3)
Chloride: 108 mmol/L (ref 98–111)
Creatinine, Ser: 0.73 mg/dL (ref 0.44–1.00)
GFR, Estimated: >60 mL/min (ref >60)
Glucose, Bld: 96 mg/dL (ref 70–99)
Potassium: 3.7 mmol/L (ref 3.5–5.1)
Sodium: 140 mmol/L (ref 135–145)

## 2021-07-30 LAB — CBC WITH DIFFERENTIAL/PLATELET
Abs Immature Granulocytes: 0.02 10*3/uL (ref 0.00–0.07)
Basophils Absolute: 0 10*3/uL (ref 0.0–0.1)
Basophils Relative: 0 %
Eosinophils Absolute: 0 10*3/uL (ref 0.0–0.5)
Eosinophils Relative: 1 %
HCT: 34.9 % — ABNORMAL LOW (ref 36.0–46.0)
Hemoglobin: 11.8 g/dL — ABNORMAL LOW (ref 12.0–15.0)
Immature Granulocytes: 0 %
Lymphocytes Relative: 30 %
Lymphs Abs: 2.1 10*3/uL (ref 0.7–4.0)
MCH: 29.8 pg (ref 26.0–34.0)
MCHC: 33.8 g/dL (ref 30.0–36.0)
MCV: 88.1 fL (ref 80.0–100.0)
Monocytes Absolute: 0.4 10*3/uL (ref 0.1–1.0)
Monocytes Relative: 5 %
Neutro Abs: 4.7 10*3/uL (ref 1.7–7.7)
Neutrophils Relative %: 64 %
Platelets: 235 10*3/uL (ref 150–400)
RBC: 3.96 MIL/uL (ref 3.87–5.11)
RDW: 11.6 % (ref 11.5–15.5)
WBC: 7.3 10*3/uL (ref 4.0–10.5)
nRBC: 0 % (ref 0.0–0.2)

## 2021-07-30 LAB — I-STAT BETA HCG BLOOD, ED (MC, WL, AP ONLY): I-stat hCG, quantitative: <5 m[IU]/mL (ref <5)

## 2021-07-30 NOTE — Telephone Encounter (Signed)
Pt in ED.  

## 2021-07-30 NOTE — Discharge Instructions (Signed)
Return for any problem.  ?

## 2021-07-30 NOTE — ED Provider Notes (Signed)
[facility]-EMERGENCY DEPT Provider Note   CSN: [id number] Arrival date & time: 07/30/21  1615     History  Chief Complaint  Patient presents with   Tingling    Allison Campbell is a 21 y.o. female.  21 year old female with prior medical history as detailed below presents for evaluation.  Patient is completing a course of azithromycin for treatment of possible sinusitis.  Patient reports several episode of paresthesias that occurred over the last 24 to 48 hours.  Patient reports intermittent tingling and discomfort of the left arm, right face, right scapula.  Symptoms may have been associated with mild hyperventilation.  Patient also reports some mild headache associated with 1 episode of the tingling discomfort.  Patient does report prior history of migraines which is usually well controlled with OTC medications.  The history is provided by the patient and medical records.       Home Medications Prior to Admission medications   Medication Sig Start Date End Date Taking? Authorizing Provider  albuterol (PROVENTIL HFA;VENTOLIN HFA) 108 (90 Base) MCG/ACT inhaler Inhale 2 puffs into the lungs every 6 (six) hours as needed for wheezing or shortness of breath. Use 20-30 mins prior to exercise 12/17/16   Cathie Hoops, Amy V, PA-C  ALPRAZolam Prudy Feeler) 0.25 MG tablet Take 1 tablet (0.25 mg total) by mouth 2 (two) times daily as needed for anxiety. Patient not taking: Reported on 07/27/2021 06/10/21   Bradd Canary, MD  azithromycin (ZITHROMAX) 250 MG tablet Take 2 tabs PO x 1 dose, then 1 tab PO QD x 4 days Patient not taking: Reported on 07/27/2021 07/26/21   Bing Neighbors, FNP  cetirizine-pseudoephedrine (ZYRTEC-D) 5-120 MG tablet Take 1 tablet by mouth daily. Patient not taking: Reported on 07/27/2021 12/17/16   Belinda Fisher, PA-C  drospirenone-ethinyl estradiol (YAZ) 3-0.02 MG tablet Take 1 tablet by mouth daily. 07/27/21   Anyanwu, Jethro Bastos, MD  Ferrous Sulfate (IRON PO)  Take by mouth daily. Patient not taking: Reported on 07/27/2021    [provider]  KETOCONAZOLE, TOPICAL, 1 % SHAM Wash twice weekly as needed 02/10/20   Bradd Canary, MD  MAGNESIUM PO Take by mouth at bedtime. Patient not taking: Reported on 07/27/2021    [provider]  montelukast (SINGULAIR) 10 MG tablet TAKE 1 TABLET BY MOUTH EVERYDAY AT BEDTIME 03/26/21   [name], NP  promethazine-dextromethorphan (PROMETHAZINE-DM) 6.25-15 MG/5ML syrup Take 5 mLs by mouth 3 (three) times daily as needed for cough. 07/26/21   Bing Neighbors, FNP  sertraline (ZOLOFT) 50 MG tablet Take 0.5 tablets (25 mg total) by mouth daily for 7 days, THEN 1 tablet (50 mg total) daily. 06/16/21 07/23/21  Bradd Canary, MD  VITAMIN D PO Take 5,000 Int'l Units by mouth daily. Patient not taking: Reported on 07/27/2021    [provider]  Vitamin D, Ergocalciferol, (DRISDOL) 1.25 MG (50000 UNIT) CAPS capsule Take 1 capsule (50,000 Units total) by mouth every 7 (seven) days. 05/04/21   [name], NP      Allergies    Amoxicillin and Penicillins    Review of Systems   Review of Systems  All other systems reviewed and are negative.   Physical Exam Updated Vital Signs BP 122/75 (BP Location: Left Arm)   Pulse 68   Temp 98.9 F (37.2 C) (Oral)   Resp 16   Ht 5\' 8"  (1.727 m)   Wt 79.8 kg   LMP 07/03/2021 (Exact Date)   SpO2  100%   BMI 26.76 kg/m  Physical Exam Vitals and nursing note reviewed.  Constitutional:      General: She is not in acute distress.    Appearance: Normal appearance. She is well-developed.  HENT:     Head: Normocephalic and atraumatic.  Eyes:     Conjunctiva/sclera: Conjunctivae normal.     Pupils: Pupils are equal, round, and reactive to light.  Cardiovascular:     Rate and Rhythm: Normal rate and regular rhythm.     Heart sounds: Normal heart sounds.  Pulmonary:     Effort: Pulmonary effort is normal. No respiratory distress.      Breath sounds: Normal breath sounds.  Abdominal:     General: There is no distension.     Palpations: Abdomen is soft.     Tenderness: There is no abdominal tenderness.  Musculoskeletal:        General: No deformity. Normal range of motion.     Cervical back: Normal range of motion and neck supple.  Skin:    General: Skin is warm and dry.  Neurological:     General: No focal deficit present.     Mental Status: She is alert and oriented to person, place, and time. Mental status is at baseline.     Cranial Nerves: No cranial nerve deficit.     Sensory: No sensory deficit.     Motor: No weakness.     Coordination: Coordination normal.     ED Results / Procedures / Treatments   Labs (all labs ordered are listed, but only abnormal results are displayed) Labs Reviewed  CBC WITH DIFFERENTIAL/PLATELET - Abnormal; Notable for the following components:      Result Value   Hemoglobin 11.8 (*)    HCT 34.9 (*)    All other components within normal limits  BASIC METABOLIC PANEL  I-STAT BETA HCG BLOOD, ED ([facility], [facility], [facility] ONLY)    EKG None  Radiology No results found.  Procedures Procedures    Medications Ordered in ED Medications - No data to display  ED Course/ Medical Decision Making/ A&P                           Medical Decision Making   Medical Screen Complete  This patient presented to the ED with complaint of tingling.  This complaint involves an extensive number of treatment options. The initial differential diagnosis includes, but is not limited to, metabolic abnormality, hyperventilation syndrome, etc.  This presentation is: Acute, Self-Limited, and Previously Undiagnosed  Patient is presenting with complaint of transient migratory paresthesia.  Presentation is not consistent with significant pathology such as stroke, MS, infection, metabolic abnormality, etc.  Screening labs obtained are without significant abnormality.  Patient is reassured after  evaluation. Patient does understand need for close outpatient follow-up.  Strict return precautions given and understood.   Additional history obtained:  Additional history obtained from [facility] External records from outside sources obtained and reviewed including prior ED visits and prior Inpatient records.    Lab Tests:  I ordered and personally interpreted labs.  The pertinent results include: CBC BMP, hCG    Problem List / ED Course:  Paresthesia   Reevaluation:  After the interventions noted above, I reevaluated the patient and found that they have: improved  Disposition:  After consideration of the diagnostic results and the patients response to treatment, I feel that the patent would benefit from close outpatient follow-up.  Final Clinical Impression(s) / ED Diagnoses Final diagnoses:  Paresthesia    Rx / DC Orders ED Discharge Orders     None         Wynetta Fines, MD 07/30/21 2038

## 2021-07-30 NOTE — ED Notes (Signed)
I provided reinforced discharge education based off of discharge summary/care provided. Pt acknowledged and understood my education. Pt had no further questions/concerns for provider/myself.   

## 2021-07-30 NOTE — Telephone Encounter (Signed)
Nurse Assessment Nurse: Thurmond Butts, RN, Meriam Sprague Date/Time (Eastern Time): 07/30/2021 3:07:32 PM Confirm and document reason for call. If symptomatic, describe symptoms. ---Caller states she has sinus infection and was seen for a sinus infection on monday and was put on azithromycin . Has been taking since. Starting having numbness in the face yesterday. It is on her right cheek and the top of the right arm also. Also pain in the back of her in the right side of head. Started yesterday but worse today. No fever Does the patient have any new or worsening symptoms? ---Yes Will a triage be completed? ---Yes Related visit to physician within the last 2 weeks? ---Yes Does the PT have any chronic conditions? (i.e. diabetes, asthma, this includes High risk factors for pregnancy, etc.) ---Yes List chronic conditions. ---asthma Is the patient pregnant or possibly pregnant? (Ask all females between the ages of 32-55) ---No Is this a behavioral health or substance abuse call? ---No Guidelines Guideline Title Affirmed Question Affirmed Notes Nurse Date/Time (Eastern Time) Neurologic Deficit [1] Numbness (i.e., loss of sensation) of the face, arm / hand, or leg / foot on one Ashley Royalty 07/30/2021 3:10:14 PM PLEASE NOTE: All timestamps contained within this report are represented as [place] Standard Time. CONFIDENTIALTY NOTICE: This fax transmission is intended only for the addressee. It contains information that is legally privileged, confidential or otherwise protected from use or disclosure. If you are not the intended recipient, you are strictly prohibited from reviewing, disclosing, copying using or disseminating any of this information or taking any action in reliance on or regarding this information. If you have received this fax in error, please notify us immediately by telephone so that we can arrange for its return to [place]. Phone: [phone], Toll-Free: [phone], Fax:  [phone] Page: 2 of 2 Call Id: [id number] Guidelines Guideline Title Affirmed Question Affirmed Notes Nurse Date/Time Lamount Cohen Time) side of the body AND [2] sudden onset AND [3] present now Disp. Time Lamount Cohen Time) Disposition Final User 07/30/2021 3:05:54 PM Send to Urgent Ruta Hinds 07/30/2021 3:11:02 PM Call EMS 911 Now Yes Ashley Royalty 07/30/2021 3:18:33 PM 911 Outcome Documentation Thurmond Butts, RN, Meriam Sprague Reason: Caller did not answer but a message was left telling her just checking to make sure got ambulance and if still needs anything from [place], call back Final Disposition 07/30/2021 3:11:02 PM Call EMS 911 Now Yes Thurmond Butts, RN, Diamantina Providence Disagree/Comply Comply Caller Understands Yes PreDisposition InappropriateToAsk Care Advice Given Per Guideline CALL EMS 911 NOW: * Immediate medical attention is needed. You need to hang up and call 911 (or an ambulance). CARE ADVICE given per Neurologic Deficit (Adult) guideline

## 2021-07-30 NOTE — Telephone Encounter (Signed)
Pt stated she was recently diagnosed with a sinus infection but after taking the medicine she noticed yesterday the right side of her face and arm started to go numb on and off. Today she is still having those issues but with tenderness around the top of her head. Transferred to triage.

## 2021-07-30 NOTE — ED Provider Triage Note (Addendum)
Emergency Medicine Provider Triage Evaluation Note  Allison Campbell , a 21 y.o. female  was evaluated in triage.  Pt complains of right side face numbness and left arm numbness yesterday without arm drift or facial asymmetry. This resolved. Now with pain along right scapula, resolved. Then developed pain along right occipital area that radiated to right side face and right arm down to right foot (not leg, just foot).  Went to UC 4 days ago for sinus infection, given meds, also nausea.  Otherwise healthy. Review of Systems  Positive: Right arm numbness (slight- pins and needle feeling) Negative: Weakness, changes in gait  Physical Exam  BP 120/73 (BP Location: Right Arm)   Pulse 63   Temp 98.9 F (37.2 C) (Oral)   Resp 18   Ht 5\' 8"  (1.727 m)   Wt 79.8 kg   LMP 07/03/2021 (Exact Date)   SpO2 100%   BMI 26.76 kg/m  Gen:   Awake, no distress   Resp:  Normal effort  MSK:   Moves extremities without difficulty  Other:    Medical Decision Making  Medically screening exam initiated at 5:28 PM.  Appropriate orders placed.  Ibeth Fahmy was informed that the remainder of the evaluation will be completed by another provider, this initial triage assessment does not replace that evaluation, and the importance of remaining in the ED until their evaluation is complete.  Currently reports tingling/altered sensation to dorsum of fingers and right hand extends up dorsal forearm to anterior right upper arm.    Madilyn Hook, PA-C 07/30/21 1730    08/01/21, PA-C 07/30/21 1733

## 2021-07-30 NOTE — ED Triage Notes (Addendum)
Per EMS- Patient is from home. Patient reports that she has right -sided tingling. Patient reorts that she has been taking antibiotics for a sinus infection, but states the tingling is different and worse than what it was yesterday.

## 2021-08-01 ENCOUNTER — Other Ambulatory Visit: Payer: Self-pay | Admitting: Family

## 2021-08-02 NOTE — Telephone Encounter (Signed)
Would you like Pt to continue Ergocalciferol?  

## 2021-08-04 ENCOUNTER — Other Ambulatory Visit: Payer: Self-pay | Admitting: Family Medicine

## 2021-08-04 MED ORDER — SERTRALINE HCL 50 MG PO TABS: 50.0000 mg | ORAL_TABLET | Freq: Every day | ORAL | 0 refills | Status: DC

## 2021-08-10 ENCOUNTER — Encounter: Payer: BC Managed Care – PPO | Admitting: Obstetrics & Gynecology

## 2021-08-30 ENCOUNTER — Encounter: Payer: Self-pay | Admitting: Family Medicine

## 2021-09-06 ENCOUNTER — Ambulatory Visit: Payer: BC Managed Care – PPO | Admitting: Family Medicine

## 2021-09-10 ENCOUNTER — Telehealth: Payer: BC Managed Care – PPO | Admitting: Family Medicine

## 2021-09-10 ENCOUNTER — Telehealth (INDEPENDENT_AMBULATORY_CARE_PROVIDER_SITE_OTHER): Payer: BC Managed Care – PPO | Admitting: Family Medicine

## 2021-09-10 DIAGNOSIS — T7840XA Allergy, unspecified, initial encounter: Secondary | ICD-10-CM | POA: Insufficient documentation

## 2021-09-10 DIAGNOSIS — J4520 Mild intermittent asthma, uncomplicated: Secondary | ICD-10-CM

## 2021-09-10 DIAGNOSIS — D649 Anemia, unspecified: Secondary | ICD-10-CM | POA: Diagnosis not present

## 2021-09-10 DIAGNOSIS — E559 Vitamin D deficiency, unspecified: Secondary | ICD-10-CM

## 2021-09-10 DIAGNOSIS — T7840XD Allergy, unspecified, subsequent encounter: Secondary | ICD-10-CM

## 2021-09-10 DIAGNOSIS — F4310 Post-traumatic stress disorder, unspecified: Secondary | ICD-10-CM

## 2021-09-10 NOTE — Progress Notes (Signed)
MyChart Video Visit    Virtual Visit via Video Note   This visit type was conducted due to national recommendations for restrictions regarding the COVID-19 Pandemic (e.g. social distancing) in an effort to limit this patient's exposure and mitigate transmission in our community. This patient is at least at moderate risk for complications without adequate follow up. This format is felt to be most appropriate for this patient at this time. Physical exam was limited by quality of the video and audio technology used for the visit. Shamaine, CMA was able to get the patient set up on a video visit.  Patient location: home Patient and provider in visit Provider location: Office  I discussed the limitations of evaluation and management by telemedicine and the availability of in person appointments. The patient expressed understanding and agreed to proceed.  Visit Date: 09/10/2021  Today's healthcare provider: Penni Homans, MD     Subjective:    Patient ID: Allison Campbell, female    DOB: 2000-04-22, 21 y.o.   MRN: [id number]  No chief complaint on file.   HPI Patient is in today for follow up on chronic medical concerns.  Overall she is doing well.  She is gone back to college at [employer].  No recent febrile illness or hospitalizations.  Overall she feels the sertraline is helping her anxiety and PTSD.  She notes she gets startled less easily and less irritable as well.  She has the worst time right before her cycles where she becomes less herself but otherwise feels she is doing well and is in no immediate danger.  She would like to remain at the current dose for the time being.  Her allergies have intermittently been flared and her PTSD sometimes makes her panic and she feels like she has trouble breathing but that has not happened in a while.  She has not needed albuterol recently is taking Zyrtec daily but does struggle with some persistent congestion. Denies CP/palp/SOB/HA/fevers/GI or GU c/o.  Taking meds as prescribed   Past Medical History:  Diagnosis Date   Asthma    cough variant worse with infection   Atypical chest pain 02/10/2020   Mild intermittent asthma 07/06/2011   Tinea versicolor 02/10/2020    Past Surgical History:  Procedure Laterality Date   WISDOM TOOTH EXTRACTION      Family History  Problem Relation Age of Onset   Diabetes Maternal Grandfather    Stroke Paternal Grandmother    Edema Paternal Grandmother    Heart disease Paternal Grandfather    Thyroid disease Mother        thyroid cancer   Hypothyroidism Mother    Hypertension Father    Hypothyroidism Maternal Grandmother    Asthma Maternal Grandmother    Cancer Neg Hx     Social History   Socioeconomic History   Marital status: Single    Spouse name: Not on file   Number of children: Not on file   Years of education: Not on file   Highest education level: Not on file  Occupational History   Not on file  Tobacco Use   Smoking status: Never   Smokeless tobacco: Never  Vaping Use   Vaping Use: Never used  Substance and Sexual Activity   Alcohol use: Not Currently    Comment: Socially   Drug use: No   Sexual activity: Yes    Partners: Male    Birth control/protection: Condom    Comment: Few days ago  Other Topics Concern  Not on file  Social History Narrative   Lives with parents, along with 2 nephews.  2 siblings (Cary and Spicer).  No passive tobacco exposure.   No dietary restrictions except no pork   [employer] as a Consulting civil engineer in Careers adviser, Programmer, applications at gym, yoga    Social Determinants of Health   Financial Resource Strain: Not on file  Food Insecurity: Not on file  Transportation Needs: Not on file  Physical Activity: Not on file  Stress: Not on file  Social Connections: Not on file  Intimate Partner Violence: Not on file    Outpatient Medications Prior to Visit  Medication Sig Dispense Refill   albuterol (PROVENTIL HFA;VENTOLIN HFA) 108 (90  Base) MCG/ACT inhaler Inhale 2 puffs into the lungs every 6 (six) hours as needed for wheezing or shortness of breath. Use 20-30 mins prior to exercise 18 g 0   drospirenone-ethinyl estradiol (YAZ) 3-0.02 MG tablet Take 1 tablet by mouth daily. 28 tablet 11   KETOCONAZOLE, TOPICAL, 1 % SHAM Wash twice weekly as needed 200 mL 5   montelukast (SINGULAIR) 10 MG tablet TAKE 1 TABLET BY MOUTH EVERYDAY AT BEDTIME 90 tablet 1   promethazine-dextromethorphan (PROMETHAZINE-DM) 6.25-15 MG/5ML syrup Take 5 mLs by mouth 3 (three) times daily as needed for cough. 140 mL 0   sertraline (ZOLOFT) 50 MG tablet Take 1 tablet (50 mg total) by mouth daily. 90 tablet 0   Vitamin D, Ergocalciferol, (DRISDOL) 1.25 MG (50000 UNIT) CAPS capsule TAKE 1 CAPSULE (50,000 UNITS TOTAL) BY MOUTH EVERY 7 (SEVEN) DAYS 12 capsule 0   ALPRAZolam (XANAX) 0.25 MG tablet Take 1 tablet (0.25 mg total) by mouth 2 (two) times daily as needed for anxiety. (Patient not taking: Reported on 07/27/2021) 20 tablet 0   azithromycin (ZITHROMAX) 250 MG tablet Take 2 tabs PO x 1 dose, then 1 tab PO QD x 4 days (Patient not taking: Reported on 07/27/2021) 6 tablet 0   cetirizine-pseudoephedrine (ZYRTEC-D) 5-120 MG tablet Take 1 tablet by mouth daily. (Patient not taking: Reported on 07/27/2021) 15 tablet 0   Ferrous Sulfate (IRON PO) Take by mouth daily. (Patient not taking: Reported on 07/27/2021)     MAGNESIUM PO Take by mouth at bedtime. (Patient not taking: Reported on 07/27/2021)     VITAMIN D PO Take 5,000 Int'l Units by mouth daily. (Patient not taking: Reported on 07/27/2021)     No facility-administered medications prior to visit.    Allergies  Allergen Reactions   Amoxicillin    Penicillins Hives    Review of Systems  Constitutional:  Negative for fever and malaise/fatigue.  HENT:  Positive for congestion.   Eyes:  Negative for blurred vision.  Respiratory:  Negative for shortness of breath.   Cardiovascular:  Negative for chest pain,  palpitations and leg swelling.  Gastrointestinal:  Negative for abdominal pain, blood in stool and nausea.  Genitourinary:  Negative for dysuria and frequency.  Musculoskeletal:  Negative for falls.  Skin:  Negative for rash.  Neurological:  Negative for dizziness, loss of consciousness and headaches.  Endo/Heme/Allergies:  Negative for environmental allergies.  Psychiatric/Behavioral:  Positive for depression. The patient is nervous/anxious.        Objective:    Physical Exam Constitutional:      General: She is not in acute distress.    Appearance: Normal appearance. She is not ill-appearing or toxic-appearing.  HENT:     Head: Normocephalic and atraumatic.     Right  Ear: External ear normal.     Left Ear: External ear normal.     Nose: Nose normal.  Eyes:     General:        Right eye: No discharge.        Left eye: No discharge.  Pulmonary:     Effort: Pulmonary effort is normal.  Skin:    Findings: No rash.  Neurological:     Mental Status: She is alert and oriented to person, place, and time.  Psychiatric:        Behavior: Behavior normal.     There were no vitals taken for this visit. Wt Readings from Last 3 Encounters:  07/30/21 176 lb (79.8 kg)  07/27/21 177 lb 3.2 oz (80.4 kg)  06/10/21 176 lb (79.8 kg)    Diabetic Foot Exam - Simple   No data filed    Lab Results  Component Value Date   WBC 7.3 07/30/2021   HGB 11.8 (L) 07/30/2021   HCT 34.9 (L) 07/30/2021   PLT 235 07/30/2021   GLUCOSE 96 07/30/2021   CHOL 158 05/03/2021   TRIG 63.0 05/03/2021   HDL 52.40 05/03/2021   LDLCALC 93 05/03/2021   ALT 12 05/03/2021   AST 19 05/03/2021   NA 140 07/30/2021   K 3.7 07/30/2021   CL 108 07/30/2021   CREATININE 0.73 07/30/2021   BUN 11 07/30/2021   CO2 25 07/30/2021   TSH 1.69 05/03/2021    Lab Results  Component Value Date   TSH 1.69 05/03/2021   Lab Results  Component Value Date   WBC 7.3 07/30/2021   HGB 11.8 (L) 07/30/2021   HCT 34.9  (L) 07/30/2021   MCV 88.1 07/30/2021   PLT 235 07/30/2021   Lab Results  Component Value Date   NA 140 07/30/2021   K 3.7 07/30/2021   CO2 25 07/30/2021   GLUCOSE 96 07/30/2021   BUN 11 07/30/2021   CREATININE 0.73 07/30/2021   BILITOT 0.5 05/03/2021   ALKPHOS 63 05/03/2021   AST 19 05/03/2021   ALT 12 05/03/2021   PROT 7.4 05/03/2021   ALBUMIN 4.7 05/03/2021   CALCIUM 9.4 07/30/2021   ANIONGAP 7 07/30/2021   GFR 105.43 05/03/2021   Lab Results  Component Value Date   CHOL 158 05/03/2021   Lab Results  Component Value Date   HDL 52.40 05/03/2021   Lab Results  Component Value Date   LDLCALC 93 05/03/2021   Lab Results  Component Value Date   TRIG 63.0 05/03/2021   Lab Results  Component Value Date   CHOLHDL 3 05/03/2021   No results found for: "HGBA1C"     Assessment & Plan:   Problem List Items Addressed This Visit     Mild intermittent asthma    Using Albuterol prn, not using it regularly      Anemia    Mild and asymptomatic, is not taking the ferrous sulfate will try a multivitamin with iron daily and monitor      Relevant Orders   CBC   Iron, TIBC and Ferritin Panel   Vitamin B6   Vitamin D deficiency    Continue the Vitamin D weekly for now and consider changing to a smaller daily amount after next blood draw      Relevant Orders   VITAMIN D 25 Hydroxy (Vit-D Deficiency, Fractures)   Comprehensive metabolic panel   PTSD (post-traumatic stress disorder)    Is better on the Sertraline 50 mg daily.  She is managing her stressful moments better so will stay with same dose for now. She is describing some Premenstrual dysphoric symptoms she can consider increasing her Sertraline to 75 mg daily for the 10 days prior to her cycles as needed.       Allergies    She takes Zyrtec daily, she has singulair and should consider taking it daily and can add Flonase and nasal saline prn       I have discontinued Ticara Yeung's  cetirizine-pseudoephedrine, VITAMIN D PO, MAGNESIUM PO, Ferrous Sulfate (IRON PO), ALPRAZolam, and azithromycin. I am also having her maintain her albuterol, KETOCONAZOLE (TOPICAL), montelukast, promethazine-dextromethorphan, drospirenone-ethinyl estradiol, Vitamin D (Ergocalciferol), and sertraline.  No orders of the defined types were placed in this encounter.   I discussed the assessment and treatment plan with the patient. The patient was provided an opportunity to ask questions and all were answered. The patient agreed with the plan and demonstrated an understanding of the instructions.   The patient was advised to call back or seek an in-person evaluation if the symptoms worsen or if the condition fails to improve as anticipated.    Danise Edge, MD [facility] at [facility] [phone] (phone) [phone] (fax)  [facility]

## 2021-09-10 NOTE — Assessment & Plan Note (Signed)
Using Albuterol prn, not using it regularly

## 2021-09-10 NOTE — Assessment & Plan Note (Signed)
Mild and asymptomatic, is not taking the ferrous sulfate will try a multivitamin with iron daily and monitor

## 2021-09-10 NOTE — Assessment & Plan Note (Signed)
She takes Zyrtec daily, she has singulair and should consider taking it daily and can add Flonase and nasal saline prn

## 2021-09-10 NOTE — Assessment & Plan Note (Addendum)
Is better on the Sertraline 50 mg daily. She is managing her stressful moments better so will stay with same dose for now. She is describing some Premenstrual dysphoric symptoms she can consider increasing her Sertraline to 75 mg daily for the 10 days prior to her cycles as needed.

## 2021-09-10 NOTE — Assessment & Plan Note (Signed)
Continue the Vitamin D weekly for now and consider changing to a smaller daily amount after next blood draw

## 2021-09-14 ENCOUNTER — Encounter: Payer: Self-pay | Admitting: Obstetrics & Gynecology

## 2021-09-14 ENCOUNTER — Other Ambulatory Visit (INDEPENDENT_AMBULATORY_CARE_PROVIDER_SITE_OTHER): Payer: BC Managed Care – PPO

## 2021-09-14 DIAGNOSIS — D649 Anemia, unspecified: Secondary | ICD-10-CM

## 2021-09-14 DIAGNOSIS — E559 Vitamin D deficiency, unspecified: Secondary | ICD-10-CM

## 2021-09-14 LAB — COMPREHENSIVE METABOLIC PANEL
ALT: 12 U/L (ref 0–35)
AST: 17 U/L (ref 0–37)
Albumin: 4.3 g/dL (ref 3.5–5.2)
Alkaline Phosphatase: 63 U/L (ref 39–117)
BUN: 8 mg/dL (ref 6–23)
CO2: 30 mEq/L (ref 19–32)
Calcium: 9.3 mg/dL (ref 8.4–10.5)
Chloride: 101 mEq/L (ref 96–112)
Creatinine, Ser: 0.71 mg/dL (ref 0.40–1.20)
GFR: 121.35 mL/min (ref >60.00)
Glucose, Bld: 90 mg/dL (ref 70–99)
Potassium: 3.7 mEq/L (ref 3.5–5.1)
Sodium: 138 mEq/L (ref 135–145)
Total Bilirubin: 0.5 mg/dL (ref 0.2–1.2)
Total Protein: 7.3 g/dL (ref 6.0–8.3)

## 2021-09-14 LAB — CBC
HCT: 34.5 % — ABNORMAL LOW (ref 36.0–46.0)
Hemoglobin: 11.9 g/dL — ABNORMAL LOW (ref 12.0–15.0)
MCHC: 34.4 g/dL (ref 30.0–36.0)
MCV: 86.7 fl (ref 78.0–100.0)
Platelets: 223 10*3/uL (ref 150.0–400.0)
RBC: 3.98 Mil/uL (ref 3.87–5.11)
RDW: 12.5 % (ref 11.5–15.5)
WBC: 7.3 10*3/uL (ref 4.0–10.5)

## 2021-09-14 LAB — VITAMIN D 25 HYDROXY (VIT D DEFICIENCY, FRACTURES): VITD: 29.75 ng/mL — ABNORMAL LOW (ref 30.00–100.00)

## 2021-09-15 ENCOUNTER — Telehealth: Payer: Self-pay | Admitting: Family Medicine

## 2021-09-15 ENCOUNTER — Other Ambulatory Visit: Payer: Self-pay | Admitting: *Deleted

## 2021-09-15 ENCOUNTER — Other Ambulatory Visit: Payer: Self-pay

## 2021-09-15 DIAGNOSIS — E559 Vitamin D deficiency, unspecified: Secondary | ICD-10-CM

## 2021-09-15 DIAGNOSIS — D649 Anemia, unspecified: Secondary | ICD-10-CM

## 2021-09-15 MED ORDER — XULANE 150-35 MCG/24HR TD PTWK: 1.0000 | MEDICATED_PATCH | TRANSDERMAL | 2 refills | Status: DC

## 2021-09-15 NOTE — Telephone Encounter (Signed)
The letter was sent to [facility]

## 2021-09-15 NOTE — Telephone Encounter (Signed)
Patient called to request a letter stating she could not go to class today due to being seen in our lab yesterday 09/05. Advised patient that letter would have yesterday's date for the appointment not today's late to which she requested for the letter to excuse her for today as well since she had to drive to [place] from her school yesterday. Patient also requested a letter excusing her from class for her 09/01 virtual appointment with Dr. Abner Greenspan. She stated Dr. Abner Greenspan had a already agreed to it and that it was just not send to her mychart. Please advise.

## 2021-09-17 ENCOUNTER — Other Ambulatory Visit: Payer: Self-pay

## 2021-09-17 DIAGNOSIS — D649 Anemia, unspecified: Secondary | ICD-10-CM

## 2021-09-17 DIAGNOSIS — E559 Vitamin D deficiency, unspecified: Secondary | ICD-10-CM

## 2021-09-17 LAB — IRON,TIBC AND FERRITIN PANEL
%SAT: 34 % (calc) (ref 16–45)
Ferritin: 26 ng/mL (ref 16–154)
Iron: 117 ug/dL (ref 40–190)
TIBC: 342 mcg/dL (calc) (ref 250–450)

## 2021-09-17 LAB — VITAMIN B6: Vitamin B6: 19.2 ng/mL (ref 2.1–21.7)

## 2021-10-07 ENCOUNTER — Ambulatory Visit: Payer: BC Managed Care – PPO | Admitting: Obstetrics & Gynecology

## 2021-10-19 ENCOUNTER — Other Ambulatory Visit: Payer: Self-pay | Admitting: Family Medicine

## 2021-10-25 ENCOUNTER — Ambulatory Visit: Payer: BC Managed Care – PPO | Admitting: Family Medicine

## 2021-10-25 ENCOUNTER — Other Ambulatory Visit (HOSPITAL_COMMUNITY)
Admission: RE | Admit: 2021-10-25 | Discharge: 2021-10-25 | Disposition: A | Discharge status: Home / Self Care | Payer: BC Managed Care – PPO | Source: Ambulatory Visit | Attending: Obstetrics & Gynecology | Admitting: Obstetrics & Gynecology

## 2021-10-25 ENCOUNTER — Encounter: Payer: Self-pay | Admitting: Family Medicine

## 2021-10-25 VITALS — BP 110/69 | HR 66 | Wt 187.0 lb

## 2021-10-25 DIAGNOSIS — N898 Other specified noninflammatory disorders of vagina: Secondary | ICD-10-CM | POA: Diagnosis not present

## 2021-10-25 DIAGNOSIS — B379 Candidiasis, unspecified: Secondary | ICD-10-CM

## 2021-10-25 DIAGNOSIS — Z30015 Encounter for initial prescription of vaginal ring hormonal contraceptive: Secondary | ICD-10-CM | POA: Diagnosis not present

## 2021-10-25 DIAGNOSIS — Z23 Encounter for immunization: Secondary | ICD-10-CM

## 2021-10-25 DIAGNOSIS — Z113 Encounter for screening for infections with a predominantly sexual mode of transmission: Secondary | ICD-10-CM | POA: Insufficient documentation

## 2021-10-25 DIAGNOSIS — N760 Acute vaginitis: Secondary | ICD-10-CM

## 2021-10-25 DIAGNOSIS — B9689 Other specified bacterial agents as the cause of diseases classified elsewhere: Secondary | ICD-10-CM

## 2021-10-25 MED ORDER — ETONOGESTREL-ETHINYL ESTRADIOL 0.12-0.015 MG/24HR VA RING: VAGINAL_RING | VAGINAL | 5 refills | Status: DC

## 2021-10-25 NOTE — Progress Notes (Signed)
RGYN patient here to F/U on Birth Control may have possible BV infection pt notes vaginal odor x 2 wks.   Pt wants STD screening as well on swab.   Pt would like to discuss HPV Vaccine as well.

## 2021-10-25 NOTE — Patient Instructions (Signed)
Natural Remedies for Bacterial Vaginosis ° °Option #1 °1 Tbsp Fractitionated Coconut Oil °10 drops of Melaleuca (Tea Tree) Oil ° °Mix ingredients together well.  Soak 3-4 tampons (in applicators) in that mixture until all or mostly all mixture is soaked up into the tampons.  Insert 1 saturated tampon vaginally and wear overnight for 3-4 nights.   ° °Option #2 (sometimes to be used in conjunction with option #1) °Fill tub with enough to cover lap/lower abdomen warm water.  Mix 1/2 cup of baking soda in water.  Soak in water/baking soda mixture for at least 20 minutes.  Be sure to swish water in between legs to get as much in vagina as possible.  This soak should be done after sexual intercourse and menstrual cycles.   ° ° °Option #3 (sometimes to be used in conjunction with option #1 and 2) °Fill tub with enough to cover lap/lower abdomen warm water.  Mix 2-4 cup of apple cider vinegar in water.  Soak in water/vinegar mixture for at least 20 minutes.  Be sure to swish water in between legs to get as much in vagina as possible.  This soak should be done after sexual intercourse and menstrual cycles.   ° °GO WHITE: °Soap: UNSCENTED Dove (white box light green writing) °Laundry detergent (underwear)- Dreft or [employer] unscented °WHITE 100% cotton panties (NOT just cotton crouch) °Sanitary napkin/panty liners: UNSCENTED.  If it doesn't SAY unscented it can have a scent/perfume    °NO PERFUMES OR LOTIONS OR POTIONS in the vulvar area (may use regular [place]) °Condoms: hypoallergenic only. Non dyed (no color) °Toilet papers: white only °Wash clothes: use a separate wash cloth. WHITE.  Wash in Dreft.  ° °You can purchase Tea Tree Oil locally at: ° °[employer] °[address] °[place] [address] ° °[employer] °[address] °[place] [address] ° °Advise that these alternatives will not replace the need to be evaluated if symptoms persist. You will need to seek care at an OB/GYN provider. ° °

## 2021-10-25 NOTE — Progress Notes (Signed)
    Contraception/Family Planning VISIT ENCOUNTER NOTE  Subjective:   Allison Campbell is a 21 y.o. G0P0000 female here for reproductive life counseling.  Desires something she doe not have to remember as often from [facility].  Reports she does not want a pregnancy in the next year. Denies abnormal vaginal bleeding, discharge, pelvic pain, problems with intercourse or other gynecologic concerns.    Gynecologic History Patient's last menstrual period was 09/27/2021 (exact date). Contraception: Ortho-Evra patches weekly  Health Maintenance Due  Topic Date Due   COVID-19 Vaccine (3 - Pfizer series) 11/08/2019   TETANUS/TDAP  07/05/2021   INFLUENZA VACCINE  Never done   HPV VACCINES (3 - 3-dose series) 02/25/2022     The following portions of the patient's history were reviewed and updated as appropriate: allergies, current medications, past family history, past medical history, past social history, past surgical history and problem list.  Review of Systems Pertinent items are noted in HPI.   Objective:  BP 110/69   Pulse 66   Wt 187 lb (84.8 kg)   LMP 09/27/2021 (Exact Date)   BMI 28.43 kg/m  Gen: well appearing, NAD HEENT: no scleral icterus CV: RR Lung: Normal WOB Ext: warm well perfused   Assessment and Plan:   Contraception counseling: Reviewed all forms of birth control options available including abstinence; over the counter/barrier methods; hormonal contraceptive medication including pill, patch, ring, injection,contraceptive implant; hormonal and nonhormonal IUDs; permanent sterilization options including vasectomy and the various tubal sterilization modalities. Risks and benefits reviewed.  Questions were answered.  Written information was also given to the patient to review.  Patient desires Nuva Ring, this was prescribed for patient. She will follow up in  1 year for surveillance.  She was told to call with any further questions, or with any concerns about this method of  contraception.  Emphasized use of condoms 100% of the time for STI prevention.  1. Screening examination for venereal disease - Cervicovaginal ancillary only( [facility])  2. Need for HPV vaccination - HPV 9-valent vaccine,Recombinat  3. Vaginal odor Possible BV Given alternative therapies in AVS await results for abx treatment  4. Encounter for initial prescription of vaginal ring hormonal contraceptive - etonogestrel-ethinyl estradiol (NUVARING) 0.12-0.015 MG/24HR vaginal ring; Insert vaginally and leave in place for 3 consecutive weeks, then remove for 1 week.  Dispense: 3 each; Refill: 5     Please refer to After Visit Summary for other counseling recommendations.   Return in about 3 months (around 01/25/2022), or if you do not like the nuvaring.  Caren Macadam, MD, MPH, ABFM Attending [place] for [facility]

## 2021-10-27 LAB — CERVICOVAGINAL ANCILLARY ONLY
Bacterial Vaginitis (gardnerella): POSITIVE — AB
Candida Glabrata: NEGATIVE
Candida Vaginitis: POSITIVE — AB
Chlamydia: NEGATIVE
Comment: NEGATIVE
Comment: NEGATIVE
Comment: NEGATIVE
Comment: NEGATIVE
Comment: NEGATIVE
Comment: NORMAL
Neisseria Gonorrhea: NEGATIVE
Trichomonas: NEGATIVE

## 2021-10-29 NOTE — Addendum Note (Signed)
Addended by: Caryl Bis on: 10/29/2021 12:19 PM   Modules accepted: Orders

## 2021-10-30 ENCOUNTER — Other Ambulatory Visit: Payer: Self-pay | Admitting: Family Medicine

## 2021-11-01 MED ORDER — METRONIDAZOLE 500 MG PO TABS: 500.0000 mg | ORAL_TABLET | Freq: Two times a day (BID) | ORAL | 0 refills | Status: DC

## 2021-11-01 MED ORDER — FLUCONAZOLE 150 MG PO TABS: 150.0000 mg | ORAL_TABLET | Freq: Once | ORAL | 2 refills | Status: AC

## 2021-11-01 NOTE — Addendum Note (Signed)
Addended by: Caryl Bis on: 11/01/2021 11:19 AM   Modules accepted: Orders

## 2021-11-06 ENCOUNTER — Ambulatory Visit
Admission: EM | Admit: 2021-11-06 | Discharge: 2021-11-06 | Disposition: A | Discharge status: Home / Self Care | Payer: BC Managed Care – PPO | Attending: Internal Medicine | Admitting: Internal Medicine

## 2021-11-06 DIAGNOSIS — H65192 Other acute nonsuppurative otitis media, left ear: Secondary | ICD-10-CM | POA: Insufficient documentation

## 2021-11-06 DIAGNOSIS — J029 Acute pharyngitis, unspecified: Secondary | ICD-10-CM | POA: Insufficient documentation

## 2021-11-06 DIAGNOSIS — J069 Acute upper respiratory infection, unspecified: Secondary | ICD-10-CM | POA: Diagnosis present

## 2021-11-06 DIAGNOSIS — Z1152 Encounter for screening for COVID-19: Secondary | ICD-10-CM | POA: Insufficient documentation

## 2021-11-06 LAB — RESP PANEL BY RT-PCR (FLU A&B, COVID) ARPGX2
Influenza A by PCR: NEGATIVE
Influenza B by PCR: NEGATIVE
SARS Coronavirus 2 by RT PCR: NEGATIVE

## 2021-11-06 LAB — POCT RAPID STREP A (OFFICE): Rapid Strep A Screen: NEGATIVE

## 2021-11-06 MED ORDER — AZITHROMYCIN 250 MG PO TABS: ORAL_TABLET | ORAL | 0 refills | Status: AC

## 2021-11-06 NOTE — ED Triage Notes (Signed)
Pt. Presents to UC w/ c/o flu-like symptoms such a nausea, fever, body aches and fatigue for the past 3 days.

## 2021-11-06 NOTE — Discharge Instructions (Addendum)
Rapid strep was negative.  Throat culture, COVID-19, flu test are pending.  We will call if they are positive.  You have a left ear infection which is being treated with an antibiotic.  You also have a viral illness that should run its course and self resolve with symptomatic treatment as we discussed.  Please follow-up if any symptoms persist or worsen.

## 2021-11-06 NOTE — ED Provider Notes (Signed)
[facility]    CSN: [id number] Arrival date & time: 11/06/21  1248      History   Chief Complaint Chief Complaint  Patient presents with   Generalized Body Aches   Fever   Nausea   Fatigue    HPI Allison Campbell is a 21 y.o. female.   Patient presents with fever, generalized body aches, fatigue, nasal congestion, cough, sore throat, ear discomfort that started about 3 days ago.  She reports that her roommate has had similar symptoms.  Tmax at home was 102.  Has taken over-the-counter cold and flu medications with minimal improvement in symptoms.  Patient does report that she has a history of asthma but denies that she has been having to use her albuterol inhaler more often while being sick and denies any associated shortness of breath.  Denies chest pain, nausea, vomiting, diarrhea, abdominal pain.  Patient is requesting flu testing.   Fever   Past Medical History:  Diagnosis Date   Asthma    cough variant worse with infection   Atypical chest pain 02/10/2020   Mild intermittent asthma 07/06/2011   Tinea versicolor 02/10/2020    Patient Active Problem List   Diagnosis Date Noted   Allergies 09/10/2021   PTSD (post-traumatic stress disorder) 06/11/2021   Vitamin D deficiency 12/30/2020   Anemia 12/29/2020   Tinea versicolor 02/10/2020   Enlarged thyroid 02/10/2020   Mild intermittent asthma 07/06/2011    Past Surgical History:  Procedure Laterality Date   WISDOM TOOTH EXTRACTION      OB History     Gravida  0   Para  0   Term  0   Preterm  0   AB  0   Living  0      SAB  0   IAB  0   Ectopic  0   Multiple  0   Live Births  0            Home Medications    Prior to Admission medications   Medication Sig Start Date End Date Taking? Authorizing Provider  azithromycin (ZITHROMAX) 250 MG tablet Take 2 tablets (500 mg total) by mouth daily for 1 day, THEN 1 tablet (250 mg total) daily for 4 days. 11/06/21 11/11/21 Yes Harlei Lehrmann, Acie Fredrickson, FNP  albuterol (PROVENTIL HFA;VENTOLIN HFA) 108 (90 Base) MCG/ACT inhaler Inhale 2 puffs into the lungs every 6 (six) hours as needed for wheezing or shortness of breath. Use 20-30 mins prior to exercise 12/17/16   Cathie Hoops, Amy V, PA-C  cetirizine (ZYRTEC) 10 MG chewable tablet Chew by mouth.    [provider]  etonogestrel-ethinyl estradiol (NUVARING) 0.12-0.015 MG/24HR vaginal ring Insert vaginally and leave in place for 3 consecutive weeks, then remove for 1 week. 10/25/21   Federico Flake, MD  fluconazole (DIFLUCAN) 150 MG tablet Take 150 mg by mouth every 3 (three) days. 11/03/21   [provider]  KETOCONAZOLE, TOPICAL, 1 % SHAM Wash twice weekly as needed 02/10/20   Bradd Canary, MD  metroNIDAZOLE (FLAGYL) 500 MG tablet Take 1 tablet (500 mg total) by mouth 2 (two) times daily. 11/01/21   Federico Flake, MD  montelukast (SINGULAIR) 10 MG tablet TAKE 1 TABLET BY MOUTH EVERYDAY AT BEDTIME 03/26/21   [name], NP  omeprazole (PRILOSEC) 40 MG capsule Take 40 mg by mouth daily. 10/05/21   [provider]  ondansetron (ZOFRAN-ODT) 4 MG disintegrating tablet Take by mouth. 11/04/21   [provider]  promethazine-dextromethorphan (PROMETHAZINE-DM) 6.25-15 MG/5ML syrup Take 5 mLs by mouth 3 (three) times daily as needed for cough. 07/26/21   Scot Jun, FNP  sertraline (ZOLOFT) 50 MG tablet TAKE 1 TABLET BY MOUTH EVERY DAY 11/01/21   Mosie Lukes, MD  Vitamin D, Ergocalciferol, (DRISDOL) 1.25 MG (50000 UNIT) CAPS capsule TAKE 1 CAPSULE (50,000 UNITS TOTAL) BY MOUTH EVERY 7 (SEVEN) DAYS 08/02/21   Mosie Lukes, MD    Family History Family History  Problem Relation Age of Onset   Diabetes Maternal Grandfather    Stroke Paternal Grandmother    Edema Paternal Grandmother    Heart disease Paternal Grandfather    Thyroid disease Mother        thyroid cancer   Hypothyroidism Mother    Hypertension Father    Hypothyroidism  Maternal Grandmother    Asthma Maternal Grandmother    Cancer Neg Hx     Social History Social History   Tobacco Use   Smoking status: Never   Smokeless tobacco: Never  Vaping Use   Vaping Use: Never used  Substance Use Topics   Alcohol use: Not Currently    Comment: Socially   Drug use: No     Allergies   Amoxicillin and Penicillins   Review of Systems Review of Systems Per HPI  Physical Exam Triage Vital Signs ED Triage Vitals [11/06/21 1306]  Enc Vitals Group     BP 98/64     Pulse Rate 98     Resp 16     Temp 99.2 F (37.3 C)     Temp src      SpO2 98 %     Weight      Height      Head Circumference      Peak Flow      Pain Score 5     Pain Loc      Pain Edu?      Excl. in [place]?    No data found.  Updated Vital Signs BP 98/64   Pulse 98   Temp 99.2 F (37.3 C)   Resp 16   LMP 11/06/2021 (Exact Date)   SpO2 98%   Visual Acuity Right Eye Distance:   Left Eye Distance:   Bilateral Distance:    Right Eye Near:   Left Eye Near:    Bilateral Near:     Physical Exam Constitutional:      General: She is not in acute distress.    Appearance: Normal appearance. She is not toxic-appearing or diaphoretic.  HENT:     Head: Normocephalic and atraumatic.     Right Ear: Tympanic membrane and ear canal normal.     Left Ear: Ear canal normal. Tympanic membrane is erythematous. Tympanic membrane is not perforated or bulging.     Nose: Congestion present.     Mouth/Throat:     Mouth: Mucous membranes are moist.     Pharynx: Posterior oropharyngeal erythema present. No pharyngeal swelling, oropharyngeal exudate or uvula swelling.     Tonsils: No tonsillar exudate or tonsillar abscesses.  Eyes:     Extraocular Movements: Extraocular movements intact.     Conjunctiva/sclera: Conjunctivae normal.     Pupils: Pupils are equal, round, and reactive to light.  Cardiovascular:     Rate and Rhythm: Normal rate and regular rhythm.     Pulses: Normal pulses.      Heart sounds: Normal heart sounds.  Pulmonary:     Effort: Pulmonary  effort is normal. No respiratory distress.     Breath sounds: Normal breath sounds. No wheezing.  Abdominal:     General: Abdomen is flat. Bowel sounds are normal.     Palpations: Abdomen is soft.  Musculoskeletal:        General: Normal range of motion.     Cervical back: Normal range of motion.  Skin:    General: Skin is warm and dry.  Neurological:     General: No focal deficit present.     Mental Status: She is alert and oriented to person, place, and time. Mental status is at baseline.  Psychiatric:        Mood and Affect: Mood normal.        Behavior: Behavior normal.      UC Treatments / Results  Labs (all labs ordered are listed, but only abnormal results are displayed) Labs Reviewed  RESP PANEL BY RT-PCR (FLU A&B, COVID) ARPGX2  CULTURE, GROUP A STREP [facility])  POCT INFLUENZA A/B  POCT RAPID STREP A (OFFICE)    EKG   Radiology No results found.  Procedures Procedures (including critical care time)  Medications Ordered in UC Medications - No data to display  Initial Impression / Assessment and Plan / UC Course  I have reviewed the triage vital signs and the nursing notes.  Pertinent labs & imaging results that were available during my care of the patient were reviewed by me and considered in my medical decision making (see chart for details).     Patient presents with symptoms likely from a viral upper respiratory infection. Differential includes bacterial pneumonia, sinusitis, allergic rhinitis, COVID-19, flu. Do not suspect underlying cardiopulmonary process. Symptoms seem unlikely related to ACS, CHF or COPD exacerbations, pneumonia, pneumothorax. Patient is nontoxic appearing and not in need of emergent medical intervention.  Rapid strep is negative.  Throat culture, COVID-19, flu test pending per patient request.  Recommended symptom control with over the counter medications.   Patient has left otitis media so will treat with azithromycin given penicillin allergy.  Return if symptoms fail to improve. Patient states understanding and is agreeable.  Discharged with PCP followup.  Final Clinical Impressions(s) / UC Diagnoses   Final diagnoses:  Other non-recurrent acute nonsuppurative otitis media of left ear  Viral upper respiratory tract infection with cough  Sore throat     Discharge Instructions      Rapid strep was negative.  Throat culture, COVID-19, flu test are pending.  We will call if they are positive.  You have a left ear infection which is being treated with an antibiotic.  You also have a viral illness that should run its course and self resolve with symptomatic treatment as we discussed.  Please follow-up if any symptoms persist or worsen.    ED Prescriptions     Medication Sig Dispense Auth. Provider   azithromycin (ZITHROMAX) 250 MG tablet Take 2 tablets (500 mg total) by mouth daily for 1 day, THEN 1 tablet (250 mg total) daily for 4 days. 6 tablet [place], Acie Fredrickson, [place]      PDMP not reviewed this encounter.   Gustavus Bryant, [place] 11/06/21 [phone]

## 2021-11-08 LAB — CULTURE, GROUP A STREP (THRC)

## 2021-11-26 ENCOUNTER — Ambulatory Visit: Payer: BC Managed Care – PPO | Admitting: Family Medicine

## 2021-12-29 ENCOUNTER — Other Ambulatory Visit: Payer: BC Managed Care – PPO

## 2021-12-29 NOTE — Assessment & Plan Note (Deleted)
Supplement and monitor 

## 2021-12-29 NOTE — Assessment & Plan Note (Deleted)
On Sertraline 50 mg daily

## 2021-12-30 ENCOUNTER — Ambulatory Visit: Payer: BC Managed Care – PPO | Admitting: Family Medicine

## 2021-12-30 DIAGNOSIS — F4310 Post-traumatic stress disorder, unspecified: Secondary | ICD-10-CM

## 2021-12-30 DIAGNOSIS — E559 Vitamin D deficiency, unspecified: Secondary | ICD-10-CM

## 2021-12-30 NOTE — Progress Notes (Shared)
Subjective:   By signing my name below, I, Vickey Sages, attest that this documentation has been prepared under the direction and in the presence of Bradd Canary, MD., 12/30/2021.   Patient ID: Allison Campbell, female    DOB: [birth year], 21 y.o.   MRN: [id number]  No chief complaint on file.  HPI Patient is in today for an office visit. She denies CP/palpitations/SOB/HA/congestion/fevers/GI or GU c/o.  Past Medical History:  Diagnosis Date   Asthma    cough variant worse with infection   Atypical chest pain 02/10/2020   Mild intermittent asthma 07/06/2011   Tinea versicolor 02/10/2020   Past Surgical History:  Procedure Laterality Date   WISDOM TOOTH EXTRACTION     Family History  Problem Relation Age of Onset   Diabetes Maternal Grandfather    Stroke Paternal Grandmother    Edema Paternal Grandmother    Heart disease Paternal Grandfather    Thyroid disease Mother        thyroid cancer   Hypothyroidism Mother    Hypertension Father    Hypothyroidism Maternal Grandmother    Asthma Maternal Grandmother    Cancer Neg Hx    Social History   Socioeconomic History   Marital status: Single    Spouse name: Not on file   Number of children: Not on file   Years of education: Not on file   Highest education level: Not on file  Occupational History   Not on file  Tobacco Use   Smoking status: Never   Smokeless tobacco: Never  Vaping Use   Vaping Use: Never used  Substance and Sexual Activity   Alcohol use: Not Currently    Comment: Socially   Drug use: No   Sexual activity: Yes    Partners: Male    Birth control/protection: Condom    Comment: Few days ago  Other Topics Concern   Not on file  Social History Narrative   Lives with parents, along with 2 nephews.  2 siblings ([place] and [place]).  No passive tobacco exposure.   No dietary restrictions except no pork   [employer] as a Consulting civil engineer in Careers adviser, Programmer, applications at gym, yoga    Social  Determinants of Health   Financial Resource Strain: Not on file  Food Insecurity: Not on file  Transportation Needs: Not on file  Physical Activity: Not on file  Stress: Not on file  Social Connections: Not on file  Intimate Partner Violence: Not on file   Outpatient Medications Prior to Visit  Medication Sig Dispense Refill   albuterol (PROVENTIL HFA;VENTOLIN HFA) 108 (90 Base) MCG/ACT inhaler Inhale 2 puffs into the lungs every 6 (six) hours as needed for wheezing or shortness of breath. Use 20-30 mins prior to exercise 18 g 0   cetirizine (ZYRTEC) 10 MG chewable tablet Chew by mouth.     etonogestrel-ethinyl estradiol (NUVARING) 0.12-0.015 MG/24HR vaginal ring Insert vaginally and leave in place for 3 consecutive weeks, then remove for 1 week. 3 each 5   fluconazole (DIFLUCAN) 150 MG tablet Take 150 mg by mouth every 3 (three) days.     KETOCONAZOLE, TOPICAL, 1 % SHAM Wash twice weekly as needed 200 mL 5   metroNIDAZOLE (FLAGYL) 500 MG tablet Take 1 tablet (500 mg total) by mouth 2 (two) times daily. 14 tablet 0   montelukast (SINGULAIR) 10 MG tablet TAKE 1 TABLET BY MOUTH EVERYDAY AT BEDTIME 90 tablet 1   omeprazole (PRILOSEC) 40 MG capsule  Take 40 mg by mouth daily.     ondansetron (ZOFRAN-ODT) 4 MG disintegrating tablet Take by mouth.     promethazine-dextromethorphan (PROMETHAZINE-DM) 6.25-15 MG/5ML syrup Take 5 mLs by mouth 3 (three) times daily as needed for cough. 140 mL 0   sertraline (ZOLOFT) 50 MG tablet TAKE 1 TABLET BY MOUTH EVERY DAY 90 tablet 0   Vitamin D, Ergocalciferol, (DRISDOL) 1.25 MG (50000 UNIT) CAPS capsule TAKE 1 CAPSULE (50,000 UNITS TOTAL) BY MOUTH EVERY 7 (SEVEN) DAYS 12 capsule 0   No facility-administered medications prior to visit.   Allergies  Allergen Reactions   Amoxicillin    Penicillins Hives   Review of Systems  Constitutional:  Negative for chills and fever.  HENT:  Negative for congestion.   Respiratory:  Negative for shortness of breath.    Cardiovascular:  Negative for chest pain and palpitations.  Gastrointestinal:  Negative for abdominal pain, blood in stool, constipation, diarrhea, nausea and vomiting.  Genitourinary:  Negative for dysuria, frequency, hematuria and urgency.  Skin:           Neurological:  Negative for headaches.      Objective:    Physical Exam Constitutional:      General: She is not in acute distress.    Appearance: Normal appearance. She is normal weight. She is not ill-appearing.  HENT:     Head: Normocephalic and atraumatic.     Right Ear: External ear normal.     Left Ear: External ear normal.     Nose: Nose normal.     Mouth/Throat:     Mouth: Mucous membranes are moist.     Pharynx: Oropharynx is clear.  Eyes:     General:        Right eye: No discharge.        Left eye: No discharge.     Extraocular Movements: Extraocular movements intact.     Conjunctiva/sclera: Conjunctivae normal.     Pupils: Pupils are equal, round, and reactive to light.  Cardiovascular:     Rate and Rhythm: Normal rate and regular rhythm.     Pulses: Normal pulses.     Heart sounds: Normal heart sounds. No murmur heard.    No gallop.  Pulmonary:     Effort: Pulmonary effort is normal. No respiratory distress.     Breath sounds: Normal breath sounds. No wheezing or rales.  Abdominal:     General: Bowel sounds are normal.     Palpations: Abdomen is soft.     Tenderness: There is no abdominal tenderness. There is no guarding.  Musculoskeletal:        General: Normal range of motion.     Cervical back: Normal range of motion.     Right lower leg: No edema.     Left lower leg: No edema.  Skin:    General: Skin is warm and dry.  Neurological:     Mental Status: She is alert and oriented to person, place, and time.  Psychiatric:        Mood and Affect: Mood normal.        Behavior: Behavior normal.        Judgment: Judgment normal.    There were no vitals taken for this visit. Wt Readings from Last  3 Encounters:  10/25/21 187 lb (84.8 kg)  07/30/21 176 lb (79.8 kg)  07/27/21 177 lb 3.2 oz (80.4 kg)   Diabetic Foot Exam - Simple   No data filed  Lab Results  Component Value Date   WBC 7.3 09/14/2021   HGB 11.9 (L) 09/14/2021   HCT 34.5 (L) 09/14/2021   PLT 223.0 09/14/2021   GLUCOSE 90 09/14/2021   CHOL 158 05/03/2021   TRIG 63.0 05/03/2021   HDL 52.40 05/03/2021   LDLCALC 93 05/03/2021   ALT 12 09/14/2021   AST 17 09/14/2021   NA 138 09/14/2021   K 3.7 09/14/2021   CL 101 09/14/2021   CREATININE 0.71 09/14/2021   BUN 8 09/14/2021   CO2 30 09/14/2021   TSH 1.69 05/03/2021   Lab Results  Component Value Date   TSH 1.69 05/03/2021   Lab Results  Component Value Date   WBC 7.3 09/14/2021   HGB 11.9 (L) 09/14/2021   HCT 34.5 (L) 09/14/2021   MCV 86.7 09/14/2021   PLT 223.0 09/14/2021   Lab Results  Component Value Date   NA 138 09/14/2021   K 3.7 09/14/2021   CO2 30 09/14/2021   GLUCOSE 90 09/14/2021   BUN 8 09/14/2021   CREATININE 0.71 09/14/2021   BILITOT 0.5 09/14/2021   ALKPHOS 63 09/14/2021   AST 17 09/14/2021   ALT 12 09/14/2021   PROT 7.3 09/14/2021   ALBUMIN 4.3 09/14/2021   CALCIUM 9.3 09/14/2021   ANIONGAP 7 07/30/2021   GFR 121.35 09/14/2021   Lab Results  Component Value Date   CHOL 158 05/03/2021   Lab Results  Component Value Date   HDL 52.40 05/03/2021   Lab Results  Component Value Date   LDLCALC 93 05/03/2021   Lab Results  Component Value Date   TRIG 63.0 05/03/2021   Lab Results  Component Value Date   CHOLHDL 3 05/03/2021   No results found for: "HGBA1C"     Assessment & Plan:   Problem List Items Addressed This Visit       Other   Vitamin D deficiency - Primary   PTSD (post-traumatic stress disorder)   No orders of the defined types were placed in this encounter.  I, Vickey Sages, personally preformed the services described in this documentation.  All medical record entries made by the scribe were  at my direction and in my presence.  I have reviewed the chart and discharge instructions (if applicable) and agree that the record reflects my personal performance and is accurate and complete. 12/30/2021  I,Mohammed Iqbal,acting as a scribe for Danise Edge, MD.,have documented all relevant documentation on the behalf of Danise Edge, MD,as directed by  Danise Edge, MD while in the presence of Danise Edge, MD.  Vickey Sages

## 2022-01-11 ENCOUNTER — Ambulatory Visit (HOSPITAL_BASED_OUTPATIENT_CLINIC_OR_DEPARTMENT_OTHER)
Admission: RE | Admit: 2022-01-11 | Discharge: 2022-01-11 | Disposition: A | Discharge status: Home / Self Care | Payer: BC Managed Care – PPO | Source: Ambulatory Visit | Attending: Family | Admitting: Family

## 2022-01-11 ENCOUNTER — Encounter: Payer: Self-pay | Admitting: Family

## 2022-01-11 ENCOUNTER — Ambulatory Visit: Payer: BC Managed Care – PPO | Admitting: Family

## 2022-01-11 VITALS — BP 110/65 | HR 82 | Temp 98.2°F | Resp 16 | Wt 177.0 lb

## 2022-01-11 DIAGNOSIS — K219 Gastro-esophageal reflux disease without esophagitis: Secondary | ICD-10-CM

## 2022-01-11 DIAGNOSIS — M25512 Pain in left shoulder: Secondary | ICD-10-CM | POA: Diagnosis not present

## 2022-01-11 DIAGNOSIS — R1012 Left upper quadrant pain: Secondary | ICD-10-CM | POA: Insufficient documentation

## 2022-01-11 LAB — POC URINALSYSI DIPSTICK (AUTOMATED)
Bilirubin, UA: NEGATIVE
Blood, UA: NEGATIVE
Glucose, UA: NEGATIVE
Ketones, UA: NEGATIVE
Nitrite, UA: NEGATIVE
Protein, UA: NEGATIVE
Spec Grav, UA: <=1.005 — AB (ref 1.010–1.025)
Urobilinogen, UA: 0.2 E.U./dL
pH, UA: 6.5 (ref 5.0–8.0)

## 2022-01-11 LAB — POCT URINE PREGNANCY: Preg Test, Ur: NEGATIVE

## 2022-01-11 MED ORDER — OMEPRAZOLE 40 MG PO CPDR: 40.0000 mg | DELAYED_RELEASE_CAPSULE | Freq: Every day | ORAL | 1 refills | Status: DC

## 2022-01-11 NOTE — Assessment & Plan Note (Signed)
Uncontrolled.  Discussed GERD dietary precautions.  Restart omeprazole 40mg  once daily.

## 2022-01-11 NOTE — Assessment & Plan Note (Addendum)
New. Suspect constipation as cause.  Discussed increasing fiber in her diet and increasing water intake. Labs as ordered. Obtain KUB to further evaluate. UPT negative.

## 2022-01-11 NOTE — Progress Notes (Signed)
Subjective:   By signing my name below, I, Shehryar Baig, attest that this documentation has been prepared under the direction and in the presence of [name], NP. 01/11/2022   Patient ID: Allison Campbell, female    DOB: 12-Nov-2000, 22 y.o.   MRN: [id number]  Chief Complaint  Patient presents with   Abdominal Pain    Complains of left sided abdominal pain     Patient is in today for a office visit.   She complains of sharp burning pain on her left upper abdomen. She reports some left arm soreness. She also has reflux. She reports eating yogurt frequently and developed sharp pain in her right abdomen and thought it may be an inflamed gallbladder. She stopped eating bread and yogurt to manage her symptoms. She has occasional constipation, otherwise her bowel movents are normal. She has occasional nausea. She denies having any fevers or blood in her stool.    Past Medical History:  Diagnosis Date   Asthma    cough variant worse with infection   Atypical chest pain 02/10/2020   Mild intermittent asthma 07/06/2011   Tinea versicolor 02/10/2020    Past Surgical History:  Procedure Laterality Date   WISDOM TOOTH EXTRACTION      Family History  Problem Relation Age of Onset   Diabetes Maternal Grandfather    Stroke Paternal Grandmother    Edema Paternal Grandmother    Heart disease Paternal Grandfather    Thyroid disease Mother        thyroid cancer   Hypothyroidism Mother    Hypertension Father    Hypothyroidism Maternal Grandmother    Asthma Maternal Grandmother    Cancer Neg Hx     Social History   Socioeconomic History   Marital status: Single    Spouse name: Not on file   Number of children: Not on file   Years of education: Not on file   Highest education level: Not on file  Occupational History   Not on file  Tobacco Use   Smoking status: Never   Smokeless tobacco: Never  Vaping Use   Vaping Use: Never used  Substance and Sexual Activity   Alcohol  use: Not Currently    Comment: Socially   Drug use: No   Sexual activity: Yes    Partners: Male    Birth control/protection: Condom    Comment: Few days ago  Other Topics Concern   Not on file  Social History Narrative   Lives with parents, along with 2 nephews.  2 siblings ([place] and [place]).  No passive tobacco exposure.   No dietary restrictions except no pork   [employer] as a Consulting civil engineer in Careers adviser, Programmer, applications at gym, yoga    Social Determinants of Health   Financial Resource Strain: Not on file  Food Insecurity: Not on file  Transportation Needs: Not on file  Physical Activity: Not on file  Stress: Not on file  Social Connections: Not on file  Intimate Partner Violence: Not on file    Outpatient Medications Prior to Visit  Medication Sig Dispense Refill   albuterol (PROVENTIL HFA;VENTOLIN HFA) 108 (90 Base) MCG/ACT inhaler Inhale 2 puffs into the lungs every 6 (six) hours as needed for wheezing or shortness of breath. Use 20-30 mins prior to exercise 18 g 0   cetirizine (ZYRTEC) 10 MG chewable tablet Chew by mouth.     etonogestrel-ethinyl estradiol (NUVARING) 0.12-0.015 MG/24HR vaginal ring Insert vaginally and leave in  place for 3 consecutive weeks, then remove for 1 week. 3 each 5   fluconazole (DIFLUCAN) 150 MG tablet Take 150 mg by mouth every 3 (three) days.     montelukast (SINGULAIR) 10 MG tablet TAKE 1 TABLET BY MOUTH EVERYDAY AT BEDTIME 90 tablet 1   omeprazole (PRILOSEC) 40 MG capsule Take 40 mg by mouth daily.     KETOCONAZOLE, TOPICAL, 1 % SHAM Wash twice weekly as needed 200 mL 5   metroNIDAZOLE (FLAGYL) 500 MG tablet Take 1 tablet (500 mg total) by mouth 2 (two) times daily. 14 tablet 0   ondansetron (ZOFRAN-ODT) 4 MG disintegrating tablet Take by mouth.     promethazine-dextromethorphan (PROMETHAZINE-DM) 6.25-15 MG/5ML syrup Take 5 mLs by mouth 3 (three) times daily as needed for cough. 140 mL 0   sertraline (ZOLOFT) 50 MG tablet TAKE 1  TABLET BY MOUTH EVERY DAY 90 tablet 0   Vitamin D, Ergocalciferol, (DRISDOL) 1.25 MG (50000 UNIT) CAPS capsule TAKE 1 CAPSULE (50,000 UNITS TOTAL) BY MOUTH EVERY 7 (SEVEN) DAYS 12 capsule 0   No facility-administered medications prior to visit.    Allergies  Allergen Reactions   Amoxicillin    Penicillins Hives    Review of Systems  Gastrointestinal:  Positive for abdominal pain (left side), heartburn and nausea.  Musculoskeletal:        (+)sore left arm pain       Objective:    Physical Exam Constitutional:      General: She is not in acute distress.    Appearance: Normal appearance. She is not ill-appearing.  HENT:     Head: Normocephalic and atraumatic.     Right Ear: External ear normal.     Left Ear: External ear normal.  Eyes:     Extraocular Movements: Extraocular movements intact.     Pupils: Pupils are equal, round, and reactive to light.  Cardiovascular:     Rate and Rhythm: Normal rate and regular rhythm.     Heart sounds: Normal heart sounds. No murmur heard.    No gallop.  Pulmonary:     Effort: Pulmonary effort is normal. No respiratory distress.     Breath sounds: Normal breath sounds. No wheezing or rales.  Abdominal:     Palpations: Abdomen is soft.     Tenderness: There is abdominal tenderness (left upper abdomen). There is no guarding.  Skin:    General: Skin is warm and dry.  Neurological:     Mental Status: She is alert and oriented to person, place, and time.  Psychiatric:        Judgment: Judgment normal.     BP 110/65 (BP Location: Left Arm, Patient Position: Sitting, Cuff Size: Small)   Pulse 82   Temp 98.2 F (36.8 C) (Oral)   Resp 16   Wt 177 lb (80.3 kg)   SpO2 100%   BMI 26.91 kg/m  Wt Readings from Last 3 Encounters:  01/11/22 177 lb (80.3 kg)  10/25/21 187 lb (84.8 kg)  07/30/21 176 lb (79.8 kg)       Assessment & Plan:  Left upper quadrant abdominal pain Assessment & Plan: New. Suspect constipation as cause.   Discussed increasing fiber in her diet and increasing water intake. Labs as ordered. Obtain KUB to further evaluate. UPT negative.   Orders: -     Lipase -     DG Abd 2 Views; Future -     Comprehensive metabolic panel -     CBC with  Differential/Platelet -     POCT urine pregnancy  Gastroesophageal reflux disease, unspecified whether esophagitis present Assessment & Plan: Uncontrolled.  Discussed GERD dietary precautions.  Restart omeprazole 40mg  once daily.    Left shoulder pain, unspecified chronicity Assessment & Plan: Patient has some reproducible soreness with movement.  Recommended short course of ibuprofen and call if symptoms worsen or if symptoms do not improve.    Other orders -     Omeprazole; Take 1 capsule (40 mg total) by mouth daily.  Dispense: 30 capsule; Refill: 1    I, Nance Pear, NP, personally preformed the services described in this documentation.  All medical record entries made by the scribe were at my direction and in my presence.  I have reviewed the chart and discharge instructions (if applicable) and agree that the record reflects my personal performance and is accurate and complete. 01/11/2022   I,Shehryar Baig,acting as a Education administrator for Nance Pear, NP.,have documented all relevant documentation on the behalf of Nance Pear, NP,as directed by  Nance Pear, NP while in the presence of Nance Pear, NP.   Nance Pear, NP

## 2022-01-11 NOTE — Assessment & Plan Note (Signed)
Patient has some reproducible soreness with movement.  Recommended short course of ibuprofen and call if symptoms worsen or if symptoms do not improve.

## 2022-01-12 ENCOUNTER — Telehealth: Payer: Self-pay | Admitting: Family Medicine

## 2022-01-12 DIAGNOSIS — K859 Acute pancreatitis without necrosis or infection, unspecified: Secondary | ICD-10-CM

## 2022-01-12 LAB — URINE CULTURE
MICRO NUMBER:: 14377184
SPECIMEN QUALITY:: ADEQUATE

## 2022-01-12 LAB — COMPREHENSIVE METABOLIC PANEL
ALT: 11 U/L (ref 0–35)
AST: 21 U/L (ref 0–37)
Albumin: 4.8 g/dL (ref 3.5–5.2)
Alkaline Phosphatase: 51 U/L (ref 39–117)
BUN: 8 mg/dL (ref 6–23)
CO2: 24 mEq/L (ref 19–32)
Calcium: 9.5 mg/dL (ref 8.4–10.5)
Chloride: 103 mEq/L (ref 96–112)
Creatinine, Ser: 0.85 mg/dL (ref 0.40–1.20)
GFR: 97.55 mL/min (ref >60.00)
Glucose, Bld: 92 mg/dL (ref 70–99)
Potassium: 4 mEq/L (ref 3.5–5.1)
Sodium: 138 mEq/L (ref 135–145)
Total Bilirubin: 0.5 mg/dL (ref 0.2–1.2)
Total Protein: 8.3 g/dL (ref 6.0–8.3)

## 2022-01-12 LAB — CBC WITH DIFFERENTIAL/PLATELET
Basophils Absolute: 0.1 10*3/uL (ref 0.0–0.1)
Basophils Relative: 0.6 % (ref 0.0–3.0)
Eosinophils Absolute: 0.1 10*3/uL (ref 0.0–0.7)
Eosinophils Relative: 0.5 % (ref 0.0–5.0)
HCT: 34.7 % — ABNORMAL LOW (ref 36.0–46.0)
Hemoglobin: 11.7 g/dL — ABNORMAL LOW (ref 12.0–15.0)
Lymphocytes Relative: 15.7 % (ref 12.0–46.0)
Lymphs Abs: 2.1 10*3/uL (ref 0.7–4.0)
MCHC: 33.7 g/dL (ref 30.0–36.0)
MCV: 85.5 fl (ref 78.0–100.0)
Monocytes Absolute: 0.4 10*3/uL (ref 0.1–1.0)
Monocytes Relative: 3.4 % (ref 3.0–12.0)
Neutro Abs: 10.5 10*3/uL — ABNORMAL HIGH (ref 1.4–7.7)
Neutrophils Relative %: 79.8 % — ABNORMAL HIGH (ref 43.0–77.0)
Platelets: 279 10*3/uL (ref 150.0–400.0)
RBC: 4.06 Mil/uL (ref 3.87–5.11)
RDW: 12.5 % (ref 11.5–15.5)
WBC: 13.1 10*3/uL — ABNORMAL HIGH (ref 4.0–10.5)

## 2022-01-12 LAB — LIPASE: Lipase: 75 U/L — ABNORMAL HIGH (ref 11.0–59.0)

## 2022-01-12 NOTE — Telephone Encounter (Signed)
Patient called to advise she has seen her lab results and some labs were high and low and she doesn't seen provider comments. Advised that she may have seen them first. Patient wanted a message sent to the provider regarding labs. Please call patient to advise

## 2022-01-13 DIAGNOSIS — K859 Acute pancreatitis without necrosis or infection, unspecified: Secondary | ICD-10-CM | POA: Insufficient documentation

## 2022-01-13 NOTE — Telephone Encounter (Signed)
Please advise pt that her abdominal x-ray is normal. No significant constipation is noted.   She is noted to have a mild elevation of her white blood cell count and mild elevation of her lipase.  This suggests she may have a mild pancreatitis.    Possible causes of pancreatitis include gallstones, alcohol use, and medications.  Estrogen is a possible medication that could cause pancreatitis.  I would recommend that she discuss this with her GYN to see if she would like to place her on a different type of birth control.   I would recommend that she avoid alcohol, try to stick to a liquid diet for the next few days, then gradually advance to regular diet as tolerated.  Return to lab in 1 week so we can make sure labs are returning to normal. Go to the ER if severe/worsening abdominal pain.

## 2022-01-13 NOTE — Telephone Encounter (Signed)
Called but no answer, lvm for patient to call back about results °

## 2022-01-13 NOTE — Telephone Encounter (Signed)
Spoke w/ Pt- informed of results and recommendations. Pt verbalized understanding. Lab appt scheduled for next week.

## 2022-01-17 ENCOUNTER — Other Ambulatory Visit: Payer: BC Managed Care – PPO

## 2022-01-17 ENCOUNTER — Other Ambulatory Visit (INDEPENDENT_AMBULATORY_CARE_PROVIDER_SITE_OTHER): Payer: BC Managed Care – PPO

## 2022-01-17 DIAGNOSIS — K859 Acute pancreatitis without necrosis or infection, unspecified: Secondary | ICD-10-CM | POA: Diagnosis not present

## 2022-01-17 LAB — CBC WITH DIFFERENTIAL/PLATELET
Basophils Absolute: 0 10*3/uL (ref 0.0–0.1)
Basophils Relative: 0.4 % (ref 0.0–3.0)
Eosinophils Absolute: 0.2 10*3/uL (ref 0.0–0.7)
Eosinophils Relative: 2.9 % (ref 0.0–5.0)
HCT: 33.9 % — ABNORMAL LOW (ref 36.0–46.0)
Hemoglobin: 11.4 g/dL — ABNORMAL LOW (ref 12.0–15.0)
Lymphocytes Relative: 36.1 % (ref 12.0–46.0)
Lymphs Abs: 2.4 10*3/uL (ref 0.7–4.0)
MCHC: 33.6 g/dL (ref 30.0–36.0)
MCV: 86 fl (ref 78.0–100.0)
Monocytes Absolute: 0.4 10*3/uL (ref 0.1–1.0)
Monocytes Relative: 6 % (ref 3.0–12.0)
Neutro Abs: 3.7 10*3/uL (ref 1.4–7.7)
Neutrophils Relative %: 54.6 % (ref 43.0–77.0)
Platelets: 257 10*3/uL (ref 150.0–400.0)
RBC: 3.94 Mil/uL (ref 3.87–5.11)
RDW: 12.2 % (ref 11.5–15.5)
WBC: 6.8 10*3/uL (ref 4.0–10.5)

## 2022-01-17 LAB — COMPREHENSIVE METABOLIC PANEL
ALT: 12 U/L (ref 0–35)
AST: 20 U/L (ref 0–37)
Albumin: 4.5 g/dL (ref 3.5–5.2)
Alkaline Phosphatase: 52 U/L (ref 39–117)
BUN: 8 mg/dL (ref 6–23)
CO2: 27 mEq/L (ref 19–32)
Calcium: 9.3 mg/dL (ref 8.4–10.5)
Chloride: 102 mEq/L (ref 96–112)
Creatinine, Ser: 0.83 mg/dL (ref 0.40–1.20)
GFR: 100.37 mL/min (ref >60.00)
Glucose, Bld: 103 mg/dL — ABNORMAL HIGH (ref 70–99)
Potassium: 4.7 mEq/L (ref 3.5–5.1)
Sodium: 137 mEq/L (ref 135–145)
Total Bilirubin: 0.4 mg/dL (ref 0.2–1.2)
Total Protein: 7.8 g/dL (ref 6.0–8.3)

## 2022-01-17 LAB — LIPASE: Lipase: 55 U/L (ref 11.0–59.0)

## 2022-01-18 ENCOUNTER — Encounter: Payer: Self-pay | Admitting: Family

## 2022-01-24 ENCOUNTER — Encounter: Payer: Self-pay | Admitting: Family Medicine

## 2022-01-24 ENCOUNTER — Telehealth: Payer: Self-pay | Admitting: Family

## 2022-01-24 DIAGNOSIS — T50905A Adverse effect of unspecified drugs, medicaments and biological substances, initial encounter: Secondary | ICD-10-CM

## 2022-01-24 NOTE — Telephone Encounter (Signed)
Left message on machine to call back  

## 2022-01-24 NOTE — Telephone Encounter (Signed)
Please schedule pt for a lab visit today if she is able.  Lab orders provided.

## 2022-01-24 NOTE — Telephone Encounter (Signed)
Lab appt scheduled.

## 2022-01-25 ENCOUNTER — Other Ambulatory Visit: Payer: BC Managed Care – PPO

## 2022-02-11 ENCOUNTER — Telehealth (INDEPENDENT_AMBULATORY_CARE_PROVIDER_SITE_OTHER): Payer: BC Managed Care – PPO | Admitting: Family

## 2022-02-11 DIAGNOSIS — D649 Anemia, unspecified: Secondary | ICD-10-CM

## 2022-02-11 DIAGNOSIS — K853 Drug induced acute pancreatitis without necrosis or infection: Secondary | ICD-10-CM

## 2022-02-11 DIAGNOSIS — M25512 Pain in left shoulder: Secondary | ICD-10-CM

## 2022-02-11 DIAGNOSIS — K219 Gastro-esophageal reflux disease without esophagitis: Secondary | ICD-10-CM | POA: Diagnosis not present

## 2022-02-11 MED ORDER — MULTI-VITAMIN/MINERALS PO TABS: 1.0000 | ORAL_TABLET | Freq: Every day | ORAL | Status: DC

## 2022-02-11 NOTE — Progress Notes (Signed)
MyChart Video Visit    Virtual Visit via Video Note   This visit type was conducted due to national recommendations for restrictions regarding the COVID-19 Pandemic (e.g. social distancing) in an effort to limit this patient's exposure and mitigate transmission in our community. This patient is at least at moderate risk for complications without adequate follow up. This format is felt to be most appropriate for this patient at this time. Physical exam was limited by quality of the video and audio technology used for the visit. CMA was able to get the patient set up on a video visit.  Patient location: Home. Patient and provider in visit Provider location: Office  I discussed the limitations of evaluation and management by telemedicine and the availability of in person appointments. The patient expressed understanding and agreed to proceed.  Visit Date: 02/11/2022  Today's healthcare provider: Nance Pear, NP     Subjective:    Patient ID: Allison Campbell, female    DOB: 08-29-2000, 22 y.o.   MRN: [id number]  Chief Complaint  Patient presents with   Abdominal Pain    Follow up   Gastroesophageal Reflux    Follow up, "doing better"    Abdominal Pain  Gastroesophageal Reflux She complains of abdominal pain.    Pt presents today for follow up.  Abdominal pain- went to urgent care.  She is working on approving her diet.  Anemia- LMP was 01/11/22.  Periods are heavy on day 2 only.  Denies black/bloody stools.   Has an appointment with GYN to discuss Nuvaring and recent pancreatitis.   GERD- made dietary improvements.   Left shoulder pain- resolved.   Past Medical History:  Diagnosis Date   Asthma    cough variant worse with infection   Atypical chest pain 02/10/2020   Mild intermittent asthma 07/06/2011   Tinea versicolor 02/10/2020    Past Surgical History:  Procedure Laterality Date   WISDOM TOOTH EXTRACTION      Family History  Problem Relation Age  of Onset   Diabetes Maternal Grandfather    Stroke Paternal Grandmother    Edema Paternal Grandmother    Heart disease Paternal Grandfather    Thyroid disease Mother        thyroid cancer   Hypothyroidism Mother    Hypertension Father    Hypothyroidism Maternal Grandmother    Asthma Maternal Grandmother    Cancer Neg Hx     Social History   Socioeconomic History   Marital status: Single    Spouse name: Not on file   Number of children: Not on file   Years of education: Not on file   Highest education level: Not on file  Occupational History   Not on file  Tobacco Use   Smoking status: Never   Smokeless tobacco: Never  Vaping Use   Vaping Use: Never used  Substance and Sexual Activity   Alcohol use: Not Currently    Comment: Socially   Drug use: No   Sexual activity: Yes    Partners: Male    Birth control/protection: Condom    Comment: Few days ago  Other Topics Concern   Not on file  Social History Narrative   Lives with parents, along with 2 nephews.  2 siblings ([place]).  No passive tobacco exposure.   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 Use 20-30 mins prior to exercise 18 g 0   cetirizine (ZYRTEC) 10 MG chewable tablet Chew by mouth.     etonogestrel-ethinyl estradiol (NUVARING) 0.12-0.015 MG/24HR vaginal ring Insert vaginally and leave in place for 3  consecutive weeks, then remove for 1 week. 3 each 5   fluconazole (DIFLUCAN) 150 MG tablet Take 150 mg by mouth every 3 (three) days.     montelukast (SINGULAIR) 10 MG tablet TAKE 1 TABLET BY MOUTH EVERYDAY AT BEDTIME 90 tablet 1   omeprazole (PRILOSEC) 40 MG capsule Take 1 capsule (40 mg total) by mouth daily. 30 capsule 1   No facility-administered medications prior to visit.    Allergies  Allergen Reactions   Amoxicillin    Penicillins Hives    Review of Systems  Gastrointestinal:  Positive for abdominal pain.   See HPI    Objective:    Physical Exam Constitutional:      General: She is not in acute distress.    Appearance: Normal appearance. She is well-developed.  HENT:     Head: Normocephalic and atraumatic.     Right Ear: External ear normal.     Left Ear: External ear normal.  Eyes:     General: No scleral icterus. Neck:     Thyroid: No thyromegaly.  Pulmonary:     Effort: Pulmonary effort is normal.  Musculoskeletal:     Cervical back: Neck supple.  Neurological:     Mental Status: She is alert and oriented to person, place, and time.  Psychiatric:        Mood and Affect: Mood normal.        Behavior: Behavior normal.        Thought Content: Thought content normal.        Judgment: Judgment normal.     There were no vitals taken for this visit. Wt Readings from Last 3 Encounters:  01/11/22 177 lb (80.3 kg)  10/25/21 187 lb (84.8 kg)  07/30/21 176 lb (79.8 kg)       Assessment & Plan:   Problem List Items Addressed This Visit       Unprioritized   Left shoulder pain    Resolved, may have been referred pain from pancreatitis.       GERD (gastroesophageal reflux disease)    Improved with changes in diet. Only using omeprazole prn.      Anemia    Mild anemia on our labs and in the [facility] system. Recommended that she add MVI with minerals.        Acute pancreatitis - Primary    Clinically resolved. Has follow up with GYN to discuss  non-estrogen containing birth control options.        I am having Allison Campbell start on multivitamin with minerals. I am also having her maintain her albuterol, montelukast, etonogestrel-ethinyl estradiol, cetirizine, fluconazole, and omeprazole.  Meds ordered this encounter  Medications   Multiple Vitamins-Minerals (MULTIVITAMIN WITH MINERALS) tablet    Sig: Take  1 tablet by mouth daily.    Order Specific Question:   Supervising Provider    Answer:   Mosie Lukes [[id number]]    I discussed the assessment and treatment plan with the patient. The patient was provided an opportunity to ask questions and all were answered. The patient agreed with the plan and demonstrated an understanding of the instructions.   The patient was advised to call back or seek an in-person evaluation if the symptoms worsen or if the condition fails to improve as anticipated.  Nance Pear, NP [facility] (phone) [phone] (fax)  [place]

## 2022-02-11 NOTE — Assessment & Plan Note (Signed)
Improved with changes in diet. Only using omeprazole prn.

## 2022-02-11 NOTE — Assessment & Plan Note (Signed)
Mild anemia on our labs and in the [facility] system. Recommended that she add MVI with minerals.

## 2022-02-11 NOTE — Assessment & Plan Note (Signed)
Clinically resolved. Has follow up with GYN to discuss non-estrogen containing birth control options.

## 2022-02-11 NOTE — Assessment & Plan Note (Signed)
Resolved, may have been referred pain from pancreatitis.

## 2022-03-01 ENCOUNTER — Encounter: Payer: Self-pay | Admitting: Obstetrics & Gynecology

## 2022-03-01 ENCOUNTER — Ambulatory Visit (INDEPENDENT_AMBULATORY_CARE_PROVIDER_SITE_OTHER): Payer: BC Managed Care – PPO | Admitting: Obstetrics & Gynecology

## 2022-03-01 ENCOUNTER — Encounter: Payer: Self-pay | Admitting: Family Medicine

## 2022-03-01 VITALS — BP 103/64 | HR 69 | Wt 181.0 lb

## 2022-03-01 DIAGNOSIS — Z3049 Encounter for surveillance of other contraceptives: Secondary | ICD-10-CM

## 2022-03-01 MED ORDER — SLYND 4 MG PO TABS: 1.0000 | ORAL_TABLET | Freq: Every day | ORAL | 0 refills | Status: DC

## 2022-03-01 NOTE — Progress Notes (Signed)
   GYNECOLOGY OFFICE VISIT NOTE  History:   Allison Campbell is a 22 y.o. G0P0000 here today for contraception surveillance.  Had recent episode of pancreatitis, was advised by her PCP to try non-estrogen contraception as exogenous estrogen could cause pancreatitis.  She was on Nuvaring.  She denies any abnormal vaginal discharge, bleeding, pelvic pain or other concerns.    Past Medical History:  Diagnosis Date   Asthma    cough variant worse with infection   Atypical chest pain 02/10/2020   Mild intermittent asthma 07/06/2011   Tinea versicolor 02/10/2020    Past Surgical History:  Procedure Laterality Date   WISDOM TOOTH EXTRACTION      The following portions of the patient's history were reviewed and updated as appropriate: allergies, current medications, past family history, past medical history, past social history, past surgical history and problem list.   Health Maintenance:  Normal pap on 07/27/2021.   Review of Systems:  Pertinent items noted in HPI and remainder of comprehensive ROS otherwise negative.  Physical Exam:  BP 103/64   Pulse 69   Wt 181 lb (82.1 kg)   LMP 02/15/2022 (Approximate)   BMI 27.52 kg/m  CONSTITUTIONAL: Well-developed, well-nourished female in no acute distress.  HEENT:  Normocephalic, atraumatic. External right and left ear normal. No scleral icterus.  NECK: Normal range of motion, supple, no masses noted on observation SKIN: No rash noted. Not diaphoretic. No erythema. No pallor. MUSCULOSKELETAL: Normal range of motion. No edema noted. NEUROLOGIC: Alert and oriented to person, place, and time. Normal muscle tone coordination. No cranial nerve deficit noted. PSYCHIATRIC: Normal mood and affect. Normal behavior. Normal judgment and thought content. CARDIOVASCULAR: Normal heart rate noted RESPIRATORY: Effort and breath sounds normal, no problems with respiration noted ABDOMEN: No masses noted. No other overt distention noted.   PELVIC: Deferred      Assessment and Plan:    1. Encounter for surveillance of contraception Discussed progestin methods: pills, Depo Provera, Nexplanon, IUDs (Kyleena/Mirena/Liletta) and non-hormonal Paragard. Risks/benefits discussed, all questions answered.  She wanted to try Slynd, two samples given to patient. Will monitor response.  She will let us know if she wants to continue with this or try other method.   Routine preventative health maintenance measures emphasized. Please refer to After Visit Summary for other counseling recommendations.   Return for any gynecologic concerns.    I spent 20 minutes dedicated to the care of this patient including pre-visit review of records, face to face time with the patient discussing her conditions and treatments and post visit orders.    Verita Schneiders, MD, [place] for [employer], [place]

## 2022-05-04 NOTE — Assessment & Plan Note (Signed)
Supplement and monitor 

## 2022-05-04 NOTE — Assessment & Plan Note (Addendum)
Patient encouraged to maintain heart healthy diet, regular exercise, adequate sleep. Consider daily probiotics. Take medications as prescribed. Labs ordered and reviewed 

## 2022-05-05 ENCOUNTER — Ambulatory Visit (INDEPENDENT_AMBULATORY_CARE_PROVIDER_SITE_OTHER): Payer: BC Managed Care – PPO | Admitting: Family Medicine

## 2022-05-05 VITALS — BP 124/68 | HR 70 | Temp 98.0°F | Resp 16 | Ht 69.0 in | Wt 189.8 lb

## 2022-05-05 DIAGNOSIS — D649 Anemia, unspecified: Secondary | ICD-10-CM | POA: Diagnosis not present

## 2022-05-05 DIAGNOSIS — Z0000 Encounter for general adult medical examination without abnormal findings: Secondary | ICD-10-CM | POA: Diagnosis not present

## 2022-05-05 DIAGNOSIS — R739 Hyperglycemia, unspecified: Secondary | ICD-10-CM | POA: Diagnosis not present

## 2022-05-05 DIAGNOSIS — Z309 Encounter for contraceptive management, unspecified: Secondary | ICD-10-CM

## 2022-05-05 DIAGNOSIS — E559 Vitamin D deficiency, unspecified: Secondary | ICD-10-CM

## 2022-05-05 NOTE — Patient Instructions (Addendum)
Psyllium/Metamucil fiber or Oat fiber caps  NOW multivitamin   Magnesium Glycinate   MenB (Bexsero) 1 shot now and 1 shot in 1 month MenACWY only 1 shot Tdap every 10 years  Preventive Care 69-22 Years Old, Female Preventive care refers to lifestyle choices and visits with your health care provider that can promote health and wellness. Preventive care visits are also called wellness exams. What can I expect for my preventive care visit? Counseling During your preventive care visit, your health care provider may ask about your: Medical history, including: Past medical problems. Family medical history. Pregnancy history. Current health, including: Menstrual cycle. Method of birth control. Emotional well-being. Home life and relationship well-being. Sexual activity and sexual health. Lifestyle, including: Alcohol, nicotine or tobacco, and drug use. Access to firearms. Diet, exercise, and sleep habits. Work and work Astronomer. Sunscreen use. Safety issues such as seatbelt and bike helmet use. Physical exam Your health care provider may check your: Height and weight. These may be used to calculate your BMI (body mass index). BMI is a measurement that tells if you are at a healthy weight. Waist circumference. This measures the distance around your waistline. This measurement also tells if you are at a healthy weight and may help predict your risk of certain diseases, such as type 2 diabetes and high blood pressure. Heart rate and blood pressure. Body temperature. Skin for abnormal spots. What immunizations do I need?  Vaccines are usually given at various ages, according to a schedule. Your health care provider will recommend vaccines for you based on your age, medical history, and lifestyle or other factors, such as travel or where you work. What tests do I need? Screening Your health care provider may recommend screening tests for certain conditions. This may  include: Pelvic exam and Pap test. Lipid and cholesterol levels. Diabetes screening. This is done by checking your blood sugar (glucose) after you have not eaten for a while (fasting). Hepatitis B test. Hepatitis C test. HIV (human immunodeficiency virus) test. STI (sexually transmitted infection) testing, if you are at risk. BRCA-related cancer screening. This may be done if you have a family history of breast, ovarian, tubal, or peritoneal cancers. Talk with your health care provider about your test results, treatment options, and if necessary, the need for more tests. Follow these instructions at home: Eating and drinking  Eat a healthy diet that includes fresh fruits and vegetables, whole grains, lean protein, and low-fat dairy products. Take vitamin and mineral supplements as recommended by your health care provider. Do not drink alcohol if: Your health care provider tells you not to drink. You are pregnant, may be pregnant, or are planning to become pregnant. If you drink alcohol: Limit how much you have to 0-1 drink a day. Know how much alcohol is in your drink. In the [place] one drink equals one 12 oz bottle of beer (355 mL), one 5 oz glass of wine (148 mL), or one 1 oz glass of hard liquor (44 mL). Lifestyle Brush your teeth every morning and night with fluoride toothpaste. Floss one time each day. Exercise for at least 30 minutes 5 or more days each week. Do not use any products that contain nicotine or tobacco. These products include cigarettes, chewing tobacco, and vaping devices, such as e-cigarettes. If you need help quitting, ask your health care provider. Do not use drugs. If you are sexually active, practice safe sex. Use a condom or other form of protection to prevent STIs. If you do  not wish to become pregnant, use a form of birth control. If you plan to become pregnant, see your health care provider for a prepregnancy visit. Find healthy ways to manage stress, such  as: Meditation, yoga, or listening to music. Journaling. Talking to a trusted person. Spending time with friends and family. Minimize exposure to UV radiation to reduce your risk of skin cancer. Safety Always wear your seat belt while driving or riding in a vehicle. Do not drive: If you have been drinking alcohol. Do not ride with someone who has been drinking. If you have been using any mind-altering substances or drugs. While texting. When you are tired or distracted. Wear a helmet and other protective equipment during sports activities. If you have firearms in your house, make sure you follow all gun safety procedures. Seek help if you have been physically or sexually abused. What's next? Go to your health care provider once a year for an annual wellness visit. Ask your health care provider how often you should have your eyes and teeth checked. Stay up to date on all vaccines. This information is not intended to replace advice given to you by your health care provider. Make sure you discuss any questions you have with your health care provider. Document Revised: 06/24/2020 Document Reviewed: 06/24/2020 Elsevier Patient Education  2023 [employer].

## 2022-05-05 NOTE — Progress Notes (Addendum)
a  Subjective:    Patient ID: Allison Campbell, female    DOB: [birth year], 22 y.o.   MRN: [id number]  No chief complaint on file.   HPI Patient is in today for annual preventative exam and follow up on chronic medical concerns. No recent febrile illness or hospitalizations. Denies CP/palp/SOB/HA/congestion/fevers/GI or GU c/o. Taking meds as prescribed. She was using Nuvaring but did not tolerate and has done better since stopping use. She is interested in considering non hormonal birth control. 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 She is staying active and maintains a heart healthy diet.   Past Medical History:  Diagnosis Date   Asthma    cough variant worse with infection   Atypical chest pain 02/10/2020   Mild intermittent asthma 07/06/2011   Tinea versicolor 02/10/2020    Past Surgical History:  Procedure Laterality Date   WISDOM TOOTH EXTRACTION      Family History  Problem Relation Age of Onset   Diabetes Maternal Grandfather    Stroke Paternal Grandmother    Edema Paternal Grandmother    Heart disease Paternal Grandfather    Thyroid disease Mother        thyroid cancer   Hypothyroidism Mother    Hypertension Father    Hypothyroidism Maternal Grandmother    Asthma Maternal Grandmother    Cancer Neg Hx     Social History   Socioeconomic History   Marital status: Single    Spouse name: Not on file   Number of children: Not on file   Years of education: Not on file   Highest education level: Not on file  Occupational History   Not on file  Tobacco Use   Smoking status: Never   Smokeless tobacco: Never  Vaping Use   Vaping Use: Never used  Substance and Sexual Activity   Alcohol use: Not Currently    Comment: Socially   Drug use: No   Sexual activity: Yes    Partners: Male    Birth control/protection: Condom    Comment: Few days ago  Other Topics Concern   Not on file  Social History Narrative   Lives  with parents, along with 2 nephews.  2 siblings (Cary and Fleetwood).  No passive tobacco exposure.   No dietary restrictions except no pork   [employer] as a Consulting civil engineer in Careers adviser, Programmer, applications at gym, yoga    Social Determinants of Health   Financial Resource Strain: Not on file  Food Insecurity: Not on file  Transportation Needs: Not on file  Physical Activity: Not on file  Stress: Not on file  Social Connections: Not on file  Intimate Partner Violence: Not on file    Outpatient Medications Prior to Visit  Medication Sig Dispense Refill   albuterol (PROVENTIL HFA;VENTOLIN HFA) 108 (90 Base) MCG/ACT inhaler Inhale 2 puffs into the lungs every 6 (six) hours as needed for wheezing or shortness of breath. Use 20-30 mins prior to exercise 18 g 0   cetirizine (ZYRTEC) 10 MG chewable tablet Chew by mouth.     Drospirenone (SLYND) 4 MG TABS Take 1 tablet (4 mg total) by mouth daily. 56 tablet 0   fluconazole (DIFLUCAN) 150 MG tablet Take 150 mg by mouth every 3 (three) days. (Patient not taking: Reported on 03/01/2022)     montelukast (SINGULAIR) 10 MG tablet TAKE 1 TABLET BY MOUTH EVERYDAY AT BEDTIME 90 tablet 1   Multiple Vitamins-Minerals (  MULTIVITAMIN WITH MINERALS) tablet Take 1 tablet by mouth daily.     omeprazole (PRILOSEC) 40 MG capsule Take 1 capsule (40 mg total) by mouth daily. (Patient not taking: Reported on 03/01/2022) 30 capsule 1   No facility-administered medications prior to visit.    Allergies  Allergen Reactions   Amoxicillin    Penicillins Hives    Review of Systems  Constitutional:  Negative for chills, fever and malaise/fatigue.  HENT:  Negative for congestion and hearing loss.   Eyes:  Negative for discharge.  Respiratory:  Negative for cough, sputum production and shortness of breath.   Cardiovascular:  Negative for chest pain, palpitations and leg swelling.  Gastrointestinal:  Negative for abdominal pain, blood in stool, constipation,  diarrhea, heartburn, nausea and vomiting.  Genitourinary:  Negative for dysuria, frequency, hematuria and urgency.  Musculoskeletal:  Negative for back pain, falls and myalgias.  Skin:  Negative for rash.  Neurological:  Negative for dizziness, sensory change, loss of consciousness, weakness and headaches.  Endo/Heme/Allergies:  Negative for environmental allergies. Does not bruise/bleed easily.  Psychiatric/Behavioral:  Negative for depression and suicidal ideas. The patient is not nervous/anxious and does not have insomnia.        Objective:    Physical Exam Constitutional:      General: She is not in acute distress.    Appearance: Normal appearance. She is not diaphoretic.  HENT:     Head: Normocephalic and atraumatic.     Right Ear: Tympanic membrane, ear canal and external ear normal.     Left Ear: Tympanic membrane, ear canal and external ear normal.     Nose: Nose normal.     Mouth/Throat:     Mouth: Mucous membranes are moist.     Pharynx: Oropharynx is clear. No oropharyngeal exudate.  Eyes:     General: No scleral icterus.       Right eye: No discharge.        Left eye: No discharge.     Conjunctiva/sclera: Conjunctivae normal.     Pupils: Pupils are equal, round, and reactive to light.  Neck:     Thyroid: No thyromegaly.  Cardiovascular:     Rate and Rhythm: Normal rate and regular rhythm.     Heart sounds: Normal heart sounds. No murmur heard. Pulmonary:     Effort: Pulmonary effort is normal. No respiratory distress.     Breath sounds: Normal breath sounds. No wheezing or rales.  Abdominal:     General: Bowel sounds are normal. There is no distension.     Palpations: Abdomen is soft. There is no mass.     Tenderness: There is no abdominal tenderness.  Musculoskeletal:        General: No tenderness. Normal range of motion.     Cervical back: Normal range of motion and neck supple.  Lymphadenopathy:     Cervical: No cervical adenopathy.  Skin:    General:  Skin is warm and dry.     Findings: No rash.  Neurological:     General: No focal deficit present.     Mental Status: She is alert and oriented to person, place, and time.     Cranial Nerves: No cranial nerve deficit.     Coordination: Coordination normal.     Deep Tendon Reflexes: Reflexes are normal and symmetric. Reflexes normal.  Psychiatric:        Mood and Affect: Mood normal.        Behavior: Behavior normal.  Thought Content: Thought content normal.        Judgment: Judgment normal.     There were no vitals taken for this visit. Wt Readings from Last 3 Encounters:  03/01/22 181 lb (82.1 kg)  01/11/22 177 lb (80.3 kg)  10/25/21 187 lb (84.8 kg)    Diabetic Foot Exam - Simple   No data filed    Lab Results  Component Value Date   WBC 6.8 01/17/2022   HGB 11.4 (L) 01/17/2022   HCT 33.9 (L) 01/17/2022   PLT 257.0 01/17/2022   GLUCOSE 103 (H) 01/17/2022   CHOL 158 05/03/2021   TRIG 63.0 05/03/2021   HDL 52.40 05/03/2021   LDLCALC 93 05/03/2021   ALT 12 01/17/2022   AST 20 01/17/2022   NA 137 01/17/2022   K 4.7 01/17/2022   CL 102 01/17/2022   CREATININE 0.83 01/17/2022   BUN 8 01/17/2022   CO2 27 01/17/2022   TSH 1.69 05/03/2021    Lab Results  Component Value Date   TSH 1.69 05/03/2021   Lab Results  Component Value Date   WBC 6.8 01/17/2022   HGB 11.4 (L) 01/17/2022   HCT 33.9 (L) 01/17/2022   MCV 86.0 01/17/2022   PLT 257.0 01/17/2022   Lab Results  Component Value Date   NA 137 01/17/2022   K 4.7 01/17/2022   CO2 27 01/17/2022   GLUCOSE 103 (H) 01/17/2022   BUN 8 01/17/2022   CREATININE 0.83 01/17/2022   BILITOT 0.4 01/17/2022   ALKPHOS 52 01/17/2022   AST 20 01/17/2022   ALT 12 01/17/2022   PROT 7.8 01/17/2022   ALBUMIN 4.5 01/17/2022   CALCIUM 9.3 01/17/2022   ANIONGAP 7 07/30/2021   GFR 100.37 01/17/2022   Lab Results  Component Value Date   CHOL 158 05/03/2021   Lab Results  Component Value Date   HDL 52.40  05/03/2021   Lab Results  Component Value Date   LDLCALC 93 05/03/2021   Lab Results  Component Value Date   TRIG 63.0 05/03/2021   Lab Results  Component Value Date   CHOLHDL 3 05/03/2021   No results found for: "HGBA1C"     Assessment & Plan:  Vitamin D deficiency Assessment & Plan: Supplement and monitor    Preventative health care Assessment & Plan: Patient encouraged to maintain heart healthy diet, regular exercise, adequate sleep. Consider daily probiotics. Take medications as prescribed      Danise Edge, MD

## 2022-05-06 ENCOUNTER — Other Ambulatory Visit: Payer: BC Managed Care – PPO

## 2022-05-08 ENCOUNTER — Encounter: Payer: Self-pay | Admitting: Family Medicine

## 2022-05-08 DIAGNOSIS — R739 Hyperglycemia, unspecified: Secondary | ICD-10-CM | POA: Insufficient documentation

## 2022-05-08 NOTE — Assessment & Plan Note (Signed)
hgba1c acceptable, minimize simple carbs. Increase exercise as tolerated.  

## 2022-05-08 NOTE — Assessment & Plan Note (Signed)
She did not tolerate Nuvaring and is interested in considering birth control without hormones. Will refer to GYN for further consideration

## 2022-05-17 ENCOUNTER — Other Ambulatory Visit (INDEPENDENT_AMBULATORY_CARE_PROVIDER_SITE_OTHER): Payer: BC Managed Care – PPO

## 2022-05-17 ENCOUNTER — Other Ambulatory Visit: Payer: BC Managed Care – PPO

## 2022-05-17 ENCOUNTER — Ambulatory Visit: Payer: BC Managed Care – PPO

## 2022-05-17 DIAGNOSIS — Z0000 Encounter for general adult medical examination without abnormal findings: Secondary | ICD-10-CM | POA: Diagnosis not present

## 2022-05-17 DIAGNOSIS — D649 Anemia, unspecified: Secondary | ICD-10-CM | POA: Diagnosis not present

## 2022-05-17 DIAGNOSIS — E559 Vitamin D deficiency, unspecified: Secondary | ICD-10-CM | POA: Diagnosis not present

## 2022-05-17 DIAGNOSIS — R739 Hyperglycemia, unspecified: Secondary | ICD-10-CM | POA: Diagnosis not present

## 2022-05-17 NOTE — Progress Notes (Deleted)
Allison Campbell is a 22 y.o. female presents to the office today for Tdap and Bexsero injections, per physician's orders. Original order: 05/05/22: "MenB (Bexsero) 1 shot now and 1 shot in 1 month, Tdap every 10 years."  Tdap 0.5 mL IM was administered *** Deltoid today. Patient tolerated injection. Bexsero 0.5 mL IM was administered *** Deltoid today. Patient tolerated injection. Patient due for follow up labs/provider appt: no Patient next injection due: n/a  Creft, Melton Alar L

## 2022-05-18 ENCOUNTER — Ambulatory Visit (INDEPENDENT_AMBULATORY_CARE_PROVIDER_SITE_OTHER): Payer: BC Managed Care – PPO | Admitting: *Deleted

## 2022-05-18 DIAGNOSIS — Z23 Encounter for immunization: Secondary | ICD-10-CM

## 2022-05-18 LAB — COMPREHENSIVE METABOLIC PANEL
ALT: 14 U/L (ref 0–35)
AST: 20 U/L (ref 0–37)
Albumin: 4.4 g/dL (ref 3.5–5.2)
Alkaline Phosphatase: 69 U/L (ref 39–117)
BUN: 10 mg/dL (ref 6–23)
CO2: 28 mEq/L (ref 19–32)
Calcium: 9 mg/dL (ref 8.4–10.5)
Chloride: 103 mEq/L (ref 96–112)
Creatinine, Ser: 0.8 mg/dL (ref 0.40–1.20)
GFR: 104.66 mL/min (ref >60.00)
Glucose, Bld: 100 mg/dL — ABNORMAL HIGH (ref 70–99)
Potassium: 4.3 mEq/L (ref 3.5–5.1)
Sodium: 138 mEq/L (ref 135–145)
Total Bilirubin: 0.5 mg/dL (ref 0.2–1.2)
Total Protein: 7.3 g/dL (ref 6.0–8.3)

## 2022-05-18 LAB — CBC WITH DIFFERENTIAL/PLATELET
Basophils Absolute: 0.1 10*3/uL (ref 0.0–0.1)
Basophils Relative: 1 % (ref 0.0–3.0)
Eosinophils Absolute: 0.2 10*3/uL (ref 0.0–0.7)
Eosinophils Relative: 2.7 % (ref 0.0–5.0)
HCT: 35.4 % — ABNORMAL LOW (ref 36.0–46.0)
Hemoglobin: 11.8 g/dL — ABNORMAL LOW (ref 12.0–15.0)
Lymphocytes Relative: 32.5 % (ref 12.0–46.0)
Lymphs Abs: 2.2 10*3/uL (ref 0.7–4.0)
MCHC: 33.3 g/dL (ref 30.0–36.0)
MCV: 86.6 fl (ref 78.0–100.0)
Monocytes Absolute: 0.4 10*3/uL (ref 0.1–1.0)
Monocytes Relative: 5.4 % (ref 3.0–12.0)
Neutro Abs: 3.9 10*3/uL (ref 1.4–7.7)
Neutrophils Relative %: 58.4 % (ref 43.0–77.0)
Platelets: 248 10*3/uL (ref 150.0–400.0)
RBC: 4.09 Mil/uL (ref 3.87–5.11)
RDW: 12.6 % (ref 11.5–15.5)
WBC: 6.7 10*3/uL (ref 4.0–10.5)

## 2022-05-18 LAB — LIPID PANEL
Cholesterol: 171 mg/dL (ref 0–200)
HDL: 53 mg/dL (ref >39.00)
LDL Cholesterol: 100 mg/dL — ABNORMAL HIGH (ref 0–99)
NonHDL: 118.04
Total CHOL/HDL Ratio: 3
Triglycerides: 88 mg/dL (ref 0.0–149.0)
VLDL: 17.6 mg/dL (ref 0.0–40.0)

## 2022-05-18 LAB — VITAMIN D 25 HYDROXY (VIT D DEFICIENCY, FRACTURES): VITD: 14.21 ng/mL — ABNORMAL LOW (ref 30.00–100.00)

## 2022-05-18 LAB — HEMOGLOBIN A1C: Hgb A1c MFr Bld: 5.9 % (ref 4.6–6.5)

## 2022-05-18 LAB — TSH: TSH: 2.43 u[IU]/mL (ref 0.35–5.50)

## 2022-05-18 NOTE — Progress Notes (Signed)
Pt is here today for Tdap, Menveo, and Bexsero immunization today per Dr. Abner Greenspan for school.  Tdap given IM in right deltoid, Menveo and Bexsero were both given IM in left deltoid. Pt tolerated well.   Pt is scheduled to come in for 2nd dose of Bexsero.

## 2022-05-19 ENCOUNTER — Other Ambulatory Visit: Payer: Self-pay

## 2022-05-19 MED ORDER — VITAMIN D (ERGOCALCIFEROL) 1.25 MG (50000 UNIT) PO CAPS: 50000.0000 [IU] | ORAL_CAPSULE | ORAL | 4 refills | Status: DC

## 2022-05-24 ENCOUNTER — Ambulatory Visit: Payer: BC Managed Care – PPO

## 2022-05-24 ENCOUNTER — Other Ambulatory Visit: Payer: BC Managed Care – PPO

## 2022-06-08 ENCOUNTER — Ambulatory Visit
Admission: EM | Admit: 2022-06-08 | Discharge: 2022-06-08 | Disposition: A | Discharge status: Home / Self Care | Payer: BC Managed Care – PPO | Attending: Urgent Care | Admitting: Urgent Care

## 2022-06-08 DIAGNOSIS — S61217A Laceration without foreign body of left little finger without damage to nail, initial encounter: Secondary | ICD-10-CM | POA: Diagnosis not present

## 2022-06-08 NOTE — Discharge Instructions (Addendum)
Avoid wetting wound, picking at glue

## 2022-06-08 NOTE — ED Provider Notes (Signed)
Allison Campbell    CSN: [id number] Arrival date & time: 06/08/22  0920      History   Chief Complaint Chief Complaint  Patient presents with   Laceration    HPI Allison Campbell is a 22 y.o. female.    Laceration   Patient presents to UC for laceration to left pinky finger today. She states she cut it with box cutter. Last Tdap 3 weeks ago.       Past Medical History:  Diagnosis Date   Asthma    cough variant worse with infection   Atypical chest pain 02/10/2020   Mild intermittent asthma 07/06/2011   Tinea versicolor 02/10/2020    Patient Active Problem List   Diagnosis Date Noted   Hyperglycemia 05/08/2022   Acute pancreatitis 01/13/2022   Left upper quadrant abdominal pain 01/11/2022   GERD (gastroesophageal reflux disease) 01/11/2022   Left shoulder pain 01/11/2022   Allergies 09/10/2021   PTSD (post-traumatic stress disorder) 06/11/2021   Vitamin D deficiency 12/30/2020   Anemia 12/29/2020   Encounter for contraceptive management 12/29/2020   Tinea versicolor 02/10/2020   Preventative health care 02/10/2020   Enlarged thyroid 02/10/2020   Mild intermittent asthma 07/06/2011    Past Surgical History:  Procedure Laterality Date   WISDOM TOOTH EXTRACTION      OB History     Gravida  0   Para  0   Term  0   Preterm  0   AB  0   Living  0      SAB  0   IAB  0   Ectopic  0   Multiple  0   Live Births  0            Home Medications    Prior to Admission medications   Medication Sig Start Date End Date Taking? Authorizing Provider  albuterol (PROVENTIL HFA;VENTOLIN HFA) 108 (90 Base) MCG/ACT inhaler Inhale 2 puffs into the lungs every 6 (six) hours as needed for wheezing or shortness of breath. Use 20-30 mins prior to exercise 12/17/16   Cathie Hoops, Amy V, PA-C  cetirizine (ZYRTEC) 10 MG chewable tablet Chew by mouth.    [provider]  Drospirenone (SLYND) 4 MG TABS Take 1 tablet (4 mg total) by mouth daily. 03/01/22    Anyanwu, Jethro Bastos, MD  fluconazole (DIFLUCAN) 150 MG tablet Take 150 mg by mouth every 3 (three) days. 11/03/21   [provider]  montelukast (SINGULAIR) 10 MG tablet TAKE 1 TABLET BY MOUTH EVERYDAY AT BEDTIME 03/26/21   [name], NP  Multiple Vitamins-Minerals (MULTIVITAMIN WITH MINERALS) tablet Take 1 tablet by mouth daily. 02/11/22   [name], NP  omeprazole (PRILOSEC) 40 MG capsule Take 1 capsule (40 mg total) by mouth daily. 01/11/22   [name], NP  Vitamin D, Ergocalciferol, (DRISDOL) 1.25 MG (50000 UNIT) CAPS capsule Take 1 capsule (50,000 Units total) by mouth every 7 (seven) days. 05/19/22   Bradd Canary, MD    Family History Family History  Problem Relation Age of Onset   Diabetes Maternal Grandfather    Stroke Paternal Grandmother    Edema Paternal Grandmother    Heart disease Paternal Grandfather    Thyroid disease Mother        thyroid cancer   Hypothyroidism Mother    Hypertension Father    Hypothyroidism Maternal Grandmother    Asthma Maternal Grandmother    Cancer Neg Hx     Social History Social History  Tobacco Use   Smoking status: Never   Smokeless tobacco: Never  Vaping Use   Vaping Use: Never used  Substance Use Topics   Alcohol use: Not Currently    Comment: Socially   Drug use: No     Allergies   Amoxicillin and Penicillins   Review of Systems Review of Systems   Physical Exam Triage Vital Signs ED Triage Vitals  Enc Vitals Group     BP 06/08/22 0943 107/72     Pulse Rate 06/08/22 0943 63     Resp 06/08/22 0943 16     Temp 06/08/22 0943 99 F (37.2 C)     Temp Source 06/08/22 0943 Oral     SpO2 06/08/22 0943 98 %     Weight --      Height --      Head Circumference --      Peak Flow --      Pain Score 06/08/22 0933 7     Pain Loc --      Pain Edu? --      Excl. in GC? --    No data found.  Updated Vital Signs BP 107/72 (BP Location: Left Arm)   Pulse 63   Temp 99 F (37.2 C) (Oral)    Resp 16   SpO2 98%   Visual Acuity Right Eye Distance:   Left Eye Distance:   Bilateral Distance:    Right Eye Near:   Left Eye Near:    Bilateral Near:     Physical Exam Vitals reviewed.  Constitutional:      Appearance: Normal appearance.  Musculoskeletal:       Hands:  Skin:    General: Skin is warm and dry.     Findings: Laceration present.  Neurological:     General: No focal deficit present.     Mental Status: She is alert and oriented to person, place, and time.  Psychiatric:        Mood and Affect: Mood normal.        Behavior: Behavior normal.      UC Treatments / Results  Labs (all labs ordered are listed, but only abnormal results are displayed) Labs Reviewed - No data to display  EKG   Radiology No results found.  Procedures Laceration Repair  Date/Time: 06/08/2022 10:03 AM  Performed by: Charma Igo, FNP Authorized by: Charma Igo, FNP   Consent:    Consent obtained:  Verbal   Consent given by:  Patient   Risks, benefits, and alternatives were discussed: yes     Risks discussed:  Pain   Alternatives discussed:  No treatment Universal protocol:    Procedure explained and questions answered to patient or proxy's satisfaction: yes     Relevant documents present and verified: yes     Test results available: no     Imaging studies available: no     Required blood products, implants, devices, and special equipment available: no     Site/side marked: no     Immediately prior to procedure, a time out was called: yes     Patient identity confirmed:  Verbally with patient Anesthesia:    Anesthesia method:  None Laceration details:    Location:  Finger   Finger location:  L small finger   Length (cm):  0.5   Depth (mm):  3 Pre-procedure details:    Preparation:  Patient was prepped and draped in usual sterile fashion Exploration:    Limited defect created (  wound extended): no     Hemostasis achieved with:  Direct pressure    Imaging obtained: bedside ultrasound     Imaging outcome: foreign body not noted     Wound exploration: entire depth of wound visualized     Contaminated: no   Treatment:    Area cleansed with:  Povidone-iodine   Amount of cleaning:  Standard   Debridement:  None   Undermining:  None   Scar revision: no   Skin repair:    Repair method:  Tissue adhesive Approximation:    Approximation:  Close Repair type:    Repair type:  Simple Post-procedure details:    Dressing:  Adhesive bandage   Procedure completion:  Tolerated  (including critical care time)  Medications Ordered in UC Medications - No data to display  Initial Impression / Assessment and Plan / UC Course  I have reviewed the triage vital signs and the nursing notes.  Pertinent labs & imaging results that were available during my care of the patient were reviewed by me and considered in my medical decision making (see chart for details).   Dermabond used to repair small finger laceration. No anesthesia.  Counseled patient on potential for adverse effects with medications prescribed/recommended today, ER and return-to-clinic precautions discussed, patient verbalized understanding and agreement with care plan.  Final Clinical Impressions(s) / UC Diagnoses   Final diagnoses:  None   Discharge Instructions   None    ED Prescriptions   None    PDMP not reviewed this encounter.   Charma Igo, FNP 06/08/22 1005

## 2022-06-08 NOTE — ED Triage Notes (Signed)
Patient presents to UC for laceration to left pinky finger today. She states she cut it with box cutter. Last Tdap 3 weeks ago.

## 2022-06-17 ENCOUNTER — Ambulatory Visit (INDEPENDENT_AMBULATORY_CARE_PROVIDER_SITE_OTHER): Payer: BC Managed Care – PPO

## 2022-06-17 DIAGNOSIS — Z23 Encounter for immunization: Secondary | ICD-10-CM

## 2022-06-17 NOTE — Progress Notes (Signed)
Allison Campbell is a 22 y.o. female presents to the office today for Bexero injections, per physician's orders. Original order: 05/05/22 Bexsero 0.5 mL  IM  was administered L Deltoid today. Patient tolerated injection. Patient due for follow up labs/provider appt: No.  Patient next injection due: n/a  Creft, Feliberto Harts   Dr Drue Novel DOD

## 2022-07-11 ENCOUNTER — Ambulatory Visit: Payer: BC Managed Care – PPO

## 2022-07-11 NOTE — Progress Notes (Deleted)
  ---  appt not needed! MenB (Bexsero) 1 shot now and 1 shot in 1 month done MenACWY only 1 shot per OV note  Bexsero: 05/18/22, 06/17/22 Men: 05/18/22  Called pt at 8:14 am to cx All imm UTD-----

## 2022-07-12 ENCOUNTER — Ambulatory Visit: Payer: BC Managed Care – PPO

## 2022-07-13 ENCOUNTER — Ambulatory Visit: Payer: BC Managed Care – PPO | Admitting: Family

## 2022-07-13 VITALS — BP 109/68 | HR 63 | Ht 69.0 in | Wt 192.4 lb

## 2022-07-13 DIAGNOSIS — K5909 Other constipation: Secondary | ICD-10-CM | POA: Diagnosis not present

## 2022-07-13 NOTE — Progress Notes (Signed)
Subjective:     Patient ID: Allison Campbell, female    DOB: 01/23/2000, 22 y.o.   MRN: [id number]  Chief Complaint  Patient presents with   Follow-up    Pt states she has been constipated on and off over a month/ pt states symptoms started after taking hi dose of vitamin D  Pt has a burning sensation on right side of lower abdomin  "Drinks a lot of water" has cut out yogurt and smoothies  Pt states she feels as though she needs to have a BM but "will not come out"  Last BM 3 days ago     HPI  Discussed the use of AI scribe software for clinical note transcription with the patient, who gave verbal consent to proceed.  History of Present Illness   The patient, with a history of constipation, presents with worsening symptoms over the past month. She describes a sensation of 'fullness' and 'burning pain' in the left lower quadrant, and intermittent back pain. She reports a bowel movement frequency of once or twice a week, with small, hard stools. She also reports occasional nausea.  Despite dietary modifications, including increased fiber and fluid intake, and the use of stool softeners and Miralax, her symptoms have not improved. She denies any changes in diet or medication, except for the addition of vitamin D. 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          Health Maintenance Due  Topic Date Due   HPV VACCINES (3 - 3-dose series) 01/17/2022    Past Medical History:  Diagnosis Date   Asthma    cough variant worse with infection   Atypical chest pain 02/10/2020   Mild intermittent asthma 07/06/2011   Tinea versicolor 02/10/2020    Past Surgical History:  Procedure Laterality Date   WISDOM TOOTH EXTRACTION      Family History  Problem Relation Age of Onset   Diabetes Maternal Grandfather    Stroke Paternal Grandmother    Edema Paternal Grandmother    Heart disease Paternal Grandfather    Thyroid disease Mother        thyroid  cancer   Hypothyroidism Mother    Hypertension Father    Hypothyroidism Maternal Grandmother    Asthma Maternal Grandmother    Cancer Neg Hx     Social History   Socioeconomic History   Marital status: Single    Spouse name: Not on file   Number of children: Not on file   Years of education: Not on file   Highest education level: Bachelor's degree (e.g., BA, AB, BS)  Occupational History   Not on file  Tobacco Use   Smoking status: Never   Smokeless tobacco: Never  Vaping Use   Vaping Use: Never used  Substance and Sexual Activity   Alcohol use: Not Currently    Comment: Socially   Drug use: No   Sexual activity: Yes    Partners: Male    Birth control/protection: Condom    Comment: Few days ago  Other Topics Concern   Not on file  Social History Narrative   Lives with parents, along with 2 nephews.  2 siblings ([place] and [place]).  No passive tobacco exposure.   No dietary restrictions except no pork   [employer] as a Consulting civil engineer in Careers adviser, Programmer, applications at gym, yoga    Social Determinants of Health   Financial Resource Strain:  Low Risk  (07/13/2022)   Overall Financial Resource Strain (CARDIA)    Difficulty of Paying Living Expenses: Not hard at all  Food Insecurity: No Food Insecurity (07/13/2022)   Hunger Vital Sign    Worried About Running Out of Food in the Last Year: Never true    Ran Out of Food in the Last Year: Never true  Transportation Needs: No Transportation Needs (07/13/2022)   PRAPARE - Administrator, Civil Service (Medical): No    Lack of Transportation (Non-Medical): No  Physical Activity: Insufficiently Active (07/13/2022)   Exercise Vital Sign    Days of Exercise per Week: 2 days    Minutes of Exercise per Session: 30 min  Stress: No Stress Concern Present (07/13/2022)   [employer] of Occupational Health - Occupational Stress Questionnaire    Feeling of Stress : Not at all  Social Connections: Moderately Integrated  (07/13/2022)   Social Connection and Isolation Panel [NHANES]    Frequency of Communication with Friends and Family: More than three times a week    Frequency of Social Gatherings with Friends and Family: Twice a week    Attends Religious Services: More than 4 times per year    Active Member of [employer] or Organizations: Yes    Attends Engineer, structural: More than 4 times per year    Marital Status: Never married  Catering manager Violence: Not on file    Outpatient Medications Prior to Visit  Medication Sig Dispense Refill   albuterol (PROVENTIL HFA;VENTOLIN HFA) 108 (90 Base) MCG/ACT inhaler Inhale 2 puffs into the lungs every 6 (six) hours as needed for wheezing or shortness of breath. Use 20-30 mins prior to exercise 18 g 0   cetirizine (ZYRTEC) 10 MG chewable tablet Chew by mouth.     Drospirenone (SLYND) 4 MG TABS Take 1 tablet (4 mg total) by mouth daily. 56 tablet 0   fluconazole (DIFLUCAN) 150 MG tablet Take 150 mg by mouth every 3 (three) days.     montelukast (SINGULAIR) 10 MG tablet TAKE 1 TABLET BY MOUTH EVERYDAY AT BEDTIME 90 tablet 1   Multiple Vitamins-Minerals (MULTIVITAMIN WITH MINERALS) tablet Take 1 tablet by mouth daily.     omeprazole (PRILOSEC) 40 MG capsule Take 1 capsule (40 mg total) by mouth daily. 30 capsule 1   Vitamin D, Ergocalciferol, (DRISDOL) 1.25 MG (50000 UNIT) CAPS capsule Take 1 capsule (50,000 Units total) by mouth every 7 (seven) days. 5 capsule 4   No facility-administered medications prior to visit.    Allergies  Allergen Reactions   Amoxicillin    Penicillins Hives    ROS     Objective:    Physical Exam Constitutional:      General: She is not in acute distress.    Appearance: Normal appearance. She is well-developed.  HENT:     Head: Normocephalic and atraumatic.     Right Ear: External ear normal.     Left Ear: External ear normal.  Eyes:     General: No scleral icterus. Neck:     Thyroid: No thyromegaly.   Cardiovascular:     Rate and Rhythm: Normal rate and regular rhythm.     Heart sounds: Normal heart sounds. No murmur heard. Pulmonary:     Effort: Pulmonary effort is normal. No respiratory distress.     Breath sounds: Normal breath sounds. No wheezing.  Abdominal:     General: There is no distension.     Palpations: Abdomen is  soft.     Tenderness: There is abdominal tenderness (mild LLQ tenderness). There is no guarding.  Musculoskeletal:     Cervical back: Neck supple.  Skin:    General: Skin is warm and dry.  Neurological:     Mental Status: She is alert and oriented to person, place, and time.  Psychiatric:        Mood and Affect: Mood normal.        Behavior: Behavior normal.        Thought Content: Thought content normal.        Judgment: Judgment normal.      BP 109/68 (BP Location: Left Arm, Patient Position: Sitting, Cuff Size: Normal)   Pulse 63   Ht 5\' 9"  (1.753 m)   Wt 192 lb 6.4 oz (87.3 kg)   LMP 06/13/2022 Comment: has been having irregular period has GYN appt 07/22/2022  SpO2 100%   BMI 28.41 kg/m  Wt Readings from Last 3 Encounters:  07/13/22 192 lb 6.4 oz (87.3 kg)  05/05/22 189 lb 12.8 oz (86.1 kg)  03/01/22 181 lb (82.1 kg)       Assessment & Plan:   Problem List Items Addressed This Visit       Unprioritized   Chronic constipation - Primary    Patient reports constipation for over a month with infrequent bowel movements, despite adequate fluid and fiber intake. Abdominal discomfort and back pain noted, likely related to constipation. No alarming symptoms such as severe abdominal pain or nausea/vomiting. -Start Metamucil once daily. -If no improvement, add Miralax once daily. -Continue high fiber diet and adequate hydration. -Contact office in a few weeks if no improvement for consideration of Linzess.       I am having Madilyn Hook maintain her albuterol, montelukast, cetirizine, fluconazole, omeprazole, multivitamin with minerals,  Slynd, and Vitamin D (Ergocalciferol).  No orders of the defined types were placed in this encounter.

## 2022-07-13 NOTE — Patient Instructions (Signed)
VISIT SUMMARY:  During your visit, we discussed your ongoing issues with constipation. Despite your efforts to manage it with dietary changes and over-the-counter remedies, you've been experiencing worsening symptoms over the past month. This includes a feeling of 'fullness', a 'burning pain' in your lower left abdomen, and occasional back pain. You also mentioned experiencing nausea from time to time.  YOUR PLAN:  -CHRONIC CONSTIPATION: Chronic constipation is a condition where you have infrequent bowel movements or difficult passage of stools that persists for several weeks or longer. We will start a new plan to manage your symptoms. This includes taking Metamucil once daily. If there's no improvement, we'll add Miralax one capful in 8 oz beverage once daily to your regimen. Continue with your high fiber diet and make sure you're drinking enough fluids. If there's still no improvement after a few weeks, please contact the office so we can consider prescribing Linzess. If you experience severe abdominal pain or nausea/vomiting, seek immediate medical attention. For immediate relief if you're feeling severely backed up, consider using a Dulcolax suppository or a fleets enema.  INSTRUCTIONS:  Start taking Metamucil once daily. If there's no improvement, add Miralax to your regimen. Continue with your high fiber diet and make sure you're drinking enough fluids. If there's still no improvement after a few weeks, please contact the office. If you experience severe abdominal pain or nausea/vomiting, seek immediate medical attention. For immediate relief if you're feeling severely backed up, consider using a Dulcolax suppository or a fleets enema.

## 2022-07-13 NOTE — Assessment & Plan Note (Addendum)
Patient reports constipation for over a month with infrequent bowel movements, despite adequate fluid and fiber intake. Abdominal discomfort and back pain noted, likely related to constipation. No alarming symptoms such as severe abdominal pain or nausea/vomiting. -Start Metamucil once daily. -If no improvement, add Miralax once daily. -Continue high fiber diet and adequate hydration. -Contact office in a few weeks if no improvement for consideration of Linzess.  -If severe abdominal pain or nausea/vomiting, seek immediate medical attention. -Consider use of Dulcolax suppository or fleets enema for acute relief if severely backed up.

## 2022-07-15 ENCOUNTER — Ambulatory Visit: Payer: BC Managed Care – PPO

## 2022-07-22 ENCOUNTER — Encounter: Payer: Self-pay | Admitting: Obstetrics and Gynecology

## 2022-07-22 ENCOUNTER — Other Ambulatory Visit (HOSPITAL_COMMUNITY)
Admission: RE | Admit: 2022-07-22 | Payer: BC Managed Care – PPO | Source: Ambulatory Visit | Admitting: Obstetrics and Gynecology

## 2022-07-22 ENCOUNTER — Ambulatory Visit (INDEPENDENT_AMBULATORY_CARE_PROVIDER_SITE_OTHER): Payer: BC Managed Care – PPO | Admitting: Obstetrics and Gynecology

## 2022-07-22 VITALS — BP 107/68 | HR 60 | Ht 68.0 in | Wt 194.0 lb

## 2022-07-22 DIAGNOSIS — Z01419 Encounter for gynecological examination (general) (routine) without abnormal findings: Secondary | ICD-10-CM | POA: Diagnosis not present

## 2022-07-22 DIAGNOSIS — Z1339 Encounter for screening examination for other mental health and behavioral disorders: Secondary | ICD-10-CM

## 2022-07-22 DIAGNOSIS — Z113 Encounter for screening for infections with a predominantly sexual mode of transmission: Secondary | ICD-10-CM | POA: Diagnosis not present

## 2022-07-22 DIAGNOSIS — N914 Secondary oligomenorrhea: Secondary | ICD-10-CM

## 2022-07-22 LAB — POCT URINE PREGNANCY: Preg Test, Ur: NEGATIVE

## 2022-07-22 NOTE — Progress Notes (Signed)
ANNUAL EXAM Patient name: Allison Campbell MRN [id number]  Date of birth: Jun 06, 2000 Chief Complaint:   Annual Exam  History of Present Illness:   Allison Campbell is a 22 y.o. G0P0000 being seen today for a routine annual exam.  Current complaints: irregular menses  Typcially will have menses at the top/end of the month and not yet  Took a pregnancy test in April and feels that the June was a totlal normal cycel, lasts 5 day  Has had 15# weight gain in a few months. No skin or hair changes. No new medications. No new stressers or chagnes in ansxiety  No breast or nipple changes. No hot flashes. No new hair growth. No vaginal dryness.   Menstrual concerns? Yes   Breast or nipple changes? No  Contraception use? Yes  Sexually active? No   Patient's last menstrual period was 06/13/2022 (exact date).   Upstream - 07/22/22 4098       Pregnancy Intention Screening   Does the patient want to become pregnant in the next year? No    Does the patient's partner want to become pregnant in the next year? No    Would the patient like to discuss contraceptive options today? No      Contraception Wrap Up   Current Method Female Condom    End Method Female Condom    Contraception Counseling Provided No    How was the end contraceptive method provided? N/A            The pregnancy intention screening data noted above was reviewed. Potential methods of contraception were discussed. The patient elected to proceed with Female Condom.   Last pap     Component Value Date/Time   DIAGPAP  07/27/2021 0944    - Negative for intraepithelial lesion or malignancy (NILM)   ADEQPAP  07/27/2021 0944    Satisfactory for evaluation; transformation zone component PRESENT.     H/O abnormal pap: no Last mammogram: n/a Last colonoscopy: n/a.      07/22/2022    9:51 AM 05/05/2022    3:38 PM 05/03/2021   10:43 AM 02/10/2020    1:30 PM  Depression screen PHQ 2/9  Decreased Interest 0 0 1 0  Down, Depressed,  Hopeless 0 0 0 0  PHQ - 2 Score 0 0 1 0  Altered sleeping 0 0    Tired, decreased energy 0 0  0  Change in appetite 1 0  0  Feeling bad or failure about yourself  0 0  0  Trouble concentrating 0 0  0  Moving slowly or fidgety/restless 0 0  0  Suicidal thoughts 0 0  0  PHQ-9 Score 1 0    Difficult doing work/chores  Not difficult at all          07/22/2022    9:51 AM 05/05/2022    3:39 PM  GAD 7 : Generalized Anxiety Score  Nervous, Anxious, on Edge 1 0  Control/stop worrying 0 0  Worry too much - different things 1 0  Trouble relaxing 0 0  Restless 0 0  Easily annoyed or irritable 0 0  Afraid - awful might happen 0 0  Total GAD 7 Score 2 0  Anxiety Difficulty  Not difficult at all     Review of Systems:   Pertinent items are noted in HPI Denies any headaches, blurred vision, fatigue, shortness of breath, chest pain, abdominal pain, abnormal vaginal discharge/itching/odor/irritation, problems with periods, bowel movements, urination, or intercourse  unless otherwise stated above. Pertinent History Reviewed:  Reviewed past medical,surgical, social and family history.  Reviewed problem list, medications and allergies. Physical Assessment:   Vitals:   07/22/22 0943  BP: 107/68  Pulse: 60  Weight: 194 lb (88 kg)  Height: 5\' 8"  (1.727 m)  Body mass index is 29.5 kg/m.        Physical Examination:   General appearance - well appearing, and in no distress  Mental status - alert, oriented to person, place, and time  Psych:  She has a normal mood and affect  Skin - warm and dry, normal color, no suspicious lesions noted  Chest - effort normal, all lung fields clear to auscultation bilaterally  Heart - normal rate and regular rhythm  Breasts - breasts appear normal, no suspicious masses, no skin or nipple changes or  axillary nodes  Abdomen - soft, nontender, nondistended, no masses or organomegaly  Pelvic -  VULVA: normal appearing vulva with no masses, tenderness or  lesions    Extremities:  No swelling or varicosities noted  Chaperone present for exam  No results found for this or any previous visit (from the past 24 hour(s)).    Assessment & Plan:  1. Well woman exam with routine gynecological exam - Cervical cancer screening: Discussed screening Q3 years. Reviewed importance of annual exams and limits of pap smear. Pap with reflex HPV u to date - GC/CT: Discussed and recommended. Pt  accepts - Gardasil: completed - Birth Control: Condoms - Breast Health: Encouraged self breast awareness/exams.  - Follow-up: 12 months and prn  - Cervicovaginal ancillary only - RPR+HBsAg+HCVAb+...  2. Secondary oligomenorrhea Labs including repeat TSH today for recent irregularity. No additional signs/symptoms associated with irregular cycle. UPT negative. Will follow up results and continue with observation unless otherwise indicated - Prolactin - TSH Rfx on Abnormal to Free T4 - Follicle stimulating hormone - POCT urine pregnancy  3. Screening examination for STI - Cervicovaginal ancillary only - RPR+HBsAg+HCVAb+...    Orders Placed This Encounter  Procedures   RPR+HBsAg+HCVAb+...   Prolactin   TSH Rfx on Abnormal to Free T4   Follicle stimulating hormone   POCT urine pregnancy    Meds: No orders of the defined types were placed in this encounter.   Follow-up: No follow-ups on file.  Lorriane Shire, MD 07/22/2022 9:55 AM

## 2022-07-25 LAB — CERVICOVAGINAL ANCILLARY ONLY
Chlamydia: NEGATIVE
Comment: NEGATIVE
Comment: NEGATIVE
Comment: NORMAL
Neisseria Gonorrhea: NEGATIVE
Trichomonas: NEGATIVE

## 2022-08-02 ENCOUNTER — Ambulatory Visit
Admission: EM | Admit: 2022-08-02 | Discharge: 2022-08-02 | Disposition: A | Discharge status: Home / Self Care | Payer: BC Managed Care – PPO | Attending: Emergency Medicine | Admitting: Emergency Medicine

## 2022-08-02 DIAGNOSIS — Z1152 Encounter for screening for COVID-19: Secondary | ICD-10-CM | POA: Diagnosis not present

## 2022-08-02 DIAGNOSIS — B9789 Other viral agents as the cause of diseases classified elsewhere: Secondary | ICD-10-CM | POA: Insufficient documentation

## 2022-08-02 DIAGNOSIS — J069 Acute upper respiratory infection, unspecified: Secondary | ICD-10-CM

## 2022-08-02 MED ORDER — PSEUDOEPH-BROMPHEN-DM 30-2-10 MG/5ML PO SYRP: 5.0000 mL | ORAL_SOLUTION | Freq: Four times a day (QID) | ORAL | 0 refills | Status: DC | PRN

## 2022-08-02 NOTE — ED Provider Notes (Signed)
Allison Campbell    CSN: [id number] Arrival date & time: 08/02/22  1720      History   Chief Complaint Chief Complaint  Patient presents with   Otalgia    HPI Allison Campbell is a 22 y.o. female.  Patient presents with 3 day history of ear pain, sore throat, congestion, sinus pressure.  No fever, rash, cough, shortness of breath, wheezing, or other symptoms.  Treating with Zyrtec.  Her medical history includes asthma and allergies.  The history is provided by the patient and medical records.    Past Medical History:  Diagnosis Date   Asthma    cough variant worse with infection   Atypical chest pain 02/10/2020   Mild intermittent asthma 07/06/2011   Tinea versicolor 02/10/2020    Patient Active Problem List   Diagnosis Date Noted   Chronic constipation 07/13/2022   Hyperglycemia 05/08/2022   Acute pancreatitis 01/13/2022   Left upper quadrant abdominal pain 01/11/2022   GERD (gastroesophageal reflux disease) 01/11/2022   Left shoulder pain 01/11/2022   Allergies 09/10/2021   PTSD (post-traumatic stress disorder) 06/11/2021   Vitamin D deficiency 12/30/2020   Anemia 12/29/2020   Encounter for contraceptive management 12/29/2020   Tinea versicolor 02/10/2020   Preventative health care 02/10/2020   Enlarged thyroid 02/10/2020   Mild intermittent asthma 07/06/2011    Past Surgical History:  Procedure Laterality Date   WISDOM TOOTH EXTRACTION      OB History     Gravida  0   Para  0   Term  0   Preterm  0   AB  0   Living  0      SAB  0   IAB  0   Ectopic  0   Multiple  0   Live Births  0            Home Medications    Prior to Admission medications   Medication Sig Start Date End Date Taking? Authorizing Provider  brompheniramine-pseudoephedrine-DM 30-2-10 MG/5ML syrup Take 5 mLs by mouth 4 (four) times daily as needed. 08/02/22  Yes Mickie Bail, NP  albuterol (PROVENTIL HFA;VENTOLIN HFA) 108 (90 Base) MCG/ACT inhaler Inhale 2  puffs into the lungs every 6 (six) hours as needed for wheezing or shortness of breath. Use 20-30 mins prior to exercise 12/17/16   Cathie Hoops, Amy V, PA-C  cetirizine (ZYRTEC) 10 MG chewable tablet Chew by mouth.    [provider]  Drospirenone (SLYND) 4 MG TABS Take 1 tablet (4 mg total) by mouth daily. Patient not taking: Reported on 07/22/2022 03/01/22   Tereso Newcomer, MD  fluconazole (DIFLUCAN) 150 MG tablet Take 150 mg by mouth every 3 (three) days. Patient not taking: Reported on 07/22/2022 11/03/21   [provider]  montelukast (SINGULAIR) 10 MG tablet TAKE 1 TABLET BY MOUTH EVERYDAY AT BEDTIME 03/26/21   [name], NP  Multiple Vitamins-Minerals (MULTIVITAMIN WITH MINERALS) tablet Take 1 tablet by mouth daily. 02/11/22   [name], NP  omeprazole (PRILOSEC) 40 MG capsule Take 1 capsule (40 mg total) by mouth daily. Patient not taking: Reported on 07/22/2022 01/11/22   [name], NP  Vitamin D, Ergocalciferol, (DRISDOL) 1.25 MG (50000 UNIT) CAPS capsule Take 1 capsule (50,000 Units total) by mouth every 7 (seven) days. 05/19/22   Bradd Canary, MD    Family History Family History  Problem Relation Age of Onset   Diabetes Maternal Grandfather    Stroke Paternal Grandmother  Edema Paternal Grandmother    Heart disease Paternal Grandfather    Thyroid disease Mother        thyroid cancer   Hypothyroidism Mother    Hypertension Father    Hypothyroidism Maternal Grandmother    Asthma Maternal Grandmother    Cancer Neg Hx     Social History Social History   Tobacco Use   Smoking status: Never   Smokeless tobacco: Never  Vaping Use   Vaping status: Never Used  Substance Use Topics   Alcohol use: Not Currently    Comment: Socially   Drug use: No     Allergies   Amoxicillin and Penicillins   Review of Systems Review of Systems  Constitutional:  Negative for chills and fever.  HENT:  Positive for congestion, ear pain, postnasal  drip, rhinorrhea and sore throat. Negative for ear discharge.   Respiratory:  Negative for cough, shortness of breath and wheezing.   Cardiovascular:  Negative for chest pain and palpitations.     Physical Exam Triage Vital Signs ED Triage Vitals  Encounter Vitals Group     BP      Systolic BP Percentile      Diastolic BP Percentile      Pulse      Resp      Temp      Temp src      SpO2      Weight      Height      Head Circumference      Peak Flow      Pain Score      Pain Loc      Pain Education      Exclude from Growth Chart    No data found.  Updated Vital Signs BP 118/80   Pulse 70   Temp 99.1 F (37.3 C)   Resp 18   LMP 06/13/2022 (Exact Date) Comment: has been having irregular period has GYN appt 07/22/2022  SpO2 98%   Visual Acuity Right Eye Distance:   Left Eye Distance:   Bilateral Distance:    Right Eye Near:   Left Eye Near:    Bilateral Near:     Physical Exam Vitals and nursing note reviewed.  Constitutional:      General: She is not in acute distress.    Appearance: She is well-developed.  HENT:     Right Ear: Tympanic membrane normal.     Left Ear: Tympanic membrane normal.     Nose: Nose normal.     Mouth/Throat:     Mouth: Mucous membranes are moist.     Pharynx: Oropharynx is clear.     Comments: Clear PND.  Cardiovascular:     Rate and Rhythm: Normal rate and regular rhythm.     Heart sounds: Normal heart sounds.  Pulmonary:     Effort: Pulmonary effort is normal. No respiratory distress.     Breath sounds: Normal breath sounds. No wheezing.  Musculoskeletal:     Cervical back: Neck supple.  Skin:    General: Skin is warm and dry.  Neurological:     Mental Status: She is alert.      UC Treatments / Results  Labs (all labs ordered are listed, but only abnormal results are displayed) Labs Reviewed  SARS CORONAVIRUS 2 (TAT 6-24 HRS)    EKG   Radiology No results found.  Procedures Procedures (including critical  care time)  Medications Ordered in UC Medications - No data to display  Initial Impression / Assessment and Plan / UC Course  I have reviewed the triage vital signs and the nursing notes.  Pertinent labs & imaging results that were available during my care of the patient were reviewed by me and considered in my medical decision making (see chart for details).    Viral URI.  COVID pending.  If COVID positive, would recommend treatment with Paxlovid as patient has history of asthma.  Last GFR 104 on 05/17/2022.  Discussed symptomatic treatment including Bromphed DM.  Instructed patient to follow up with her PCP if symptoms are not improving.  She agrees to plan of care.   Final Clinical Impressions(s) / UC Diagnoses   Final diagnoses:  Viral URI     Discharge Instructions      Take the Bromphed-DM as directed.    Your COVID test is pending.    Follow-up with your primary care provider if your symptoms are not improving.            ED Prescriptions     Medication Sig Dispense Auth. Provider   brompheniramine-pseudoephedrine-DM 30-2-10 MG/5ML syrup Take 5 mLs by mouth 4 (four) times daily as needed. 120 mL Mickie Bail, NP      PDMP not reviewed this encounter.   Mickie Bail, NP 08/02/22 [phone]

## 2022-08-02 NOTE — ED Triage Notes (Signed)
Patient to Urgent Care with complaints of left sided ear pain/ sore throat/ sinus pressure. Sinus headaches. Fatigue.   Symptoms started three days ago. Reports bad seasonal allergies. Taking zyrtec.   Possible fevers- max temp.

## 2022-08-02 NOTE — Discharge Instructions (Signed)
Take the Bromphed-DM as directed.    Your COVID test is pending.    Follow-up with your primary care provider if your symptoms are not improving.

## 2022-08-03 LAB — SARS CORONAVIRUS 2 (TAT 6-24 HRS): SARS Coronavirus 2: NEGATIVE

## 2022-08-04 IMAGING — DX DG CHEST 2V
2 series · 2 of 2 positions shown · non-contrast
Comparison: None.

CLINICAL DATA: Chest pain, asthma

EXAM:
CHEST - 2 VIEW

[chest pa]
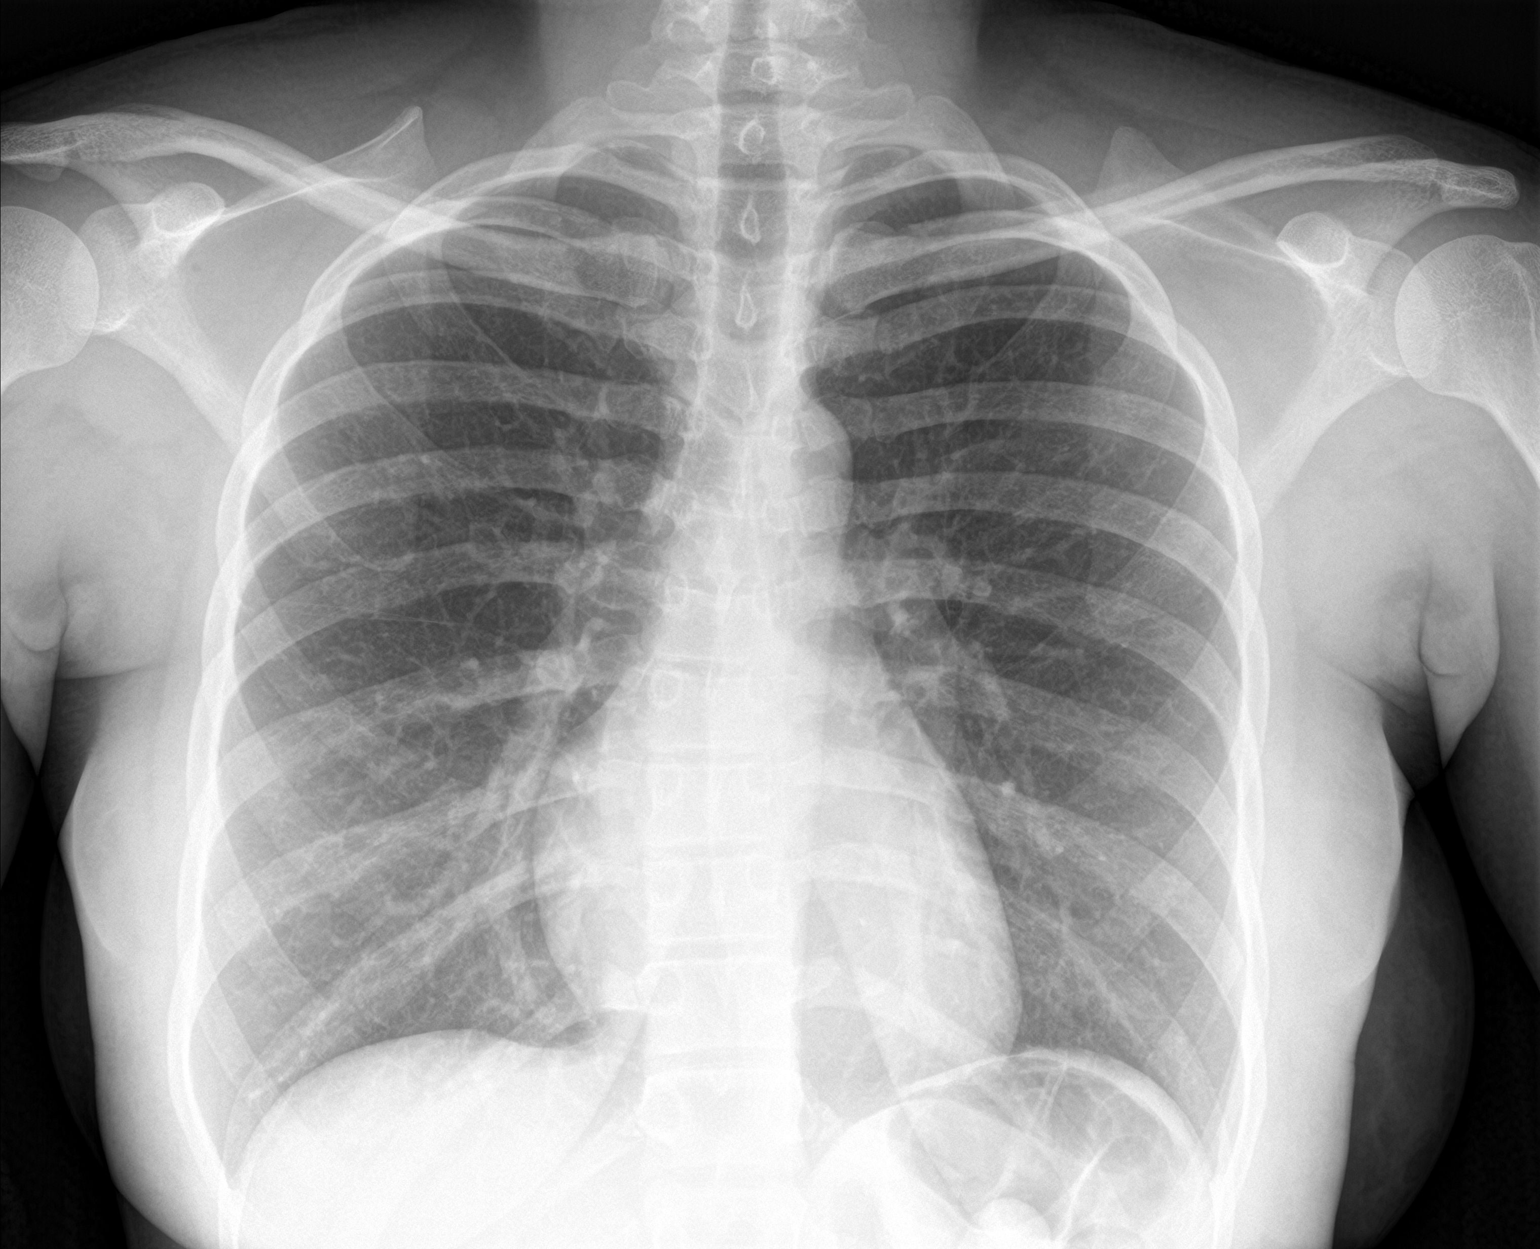

[chest lat]
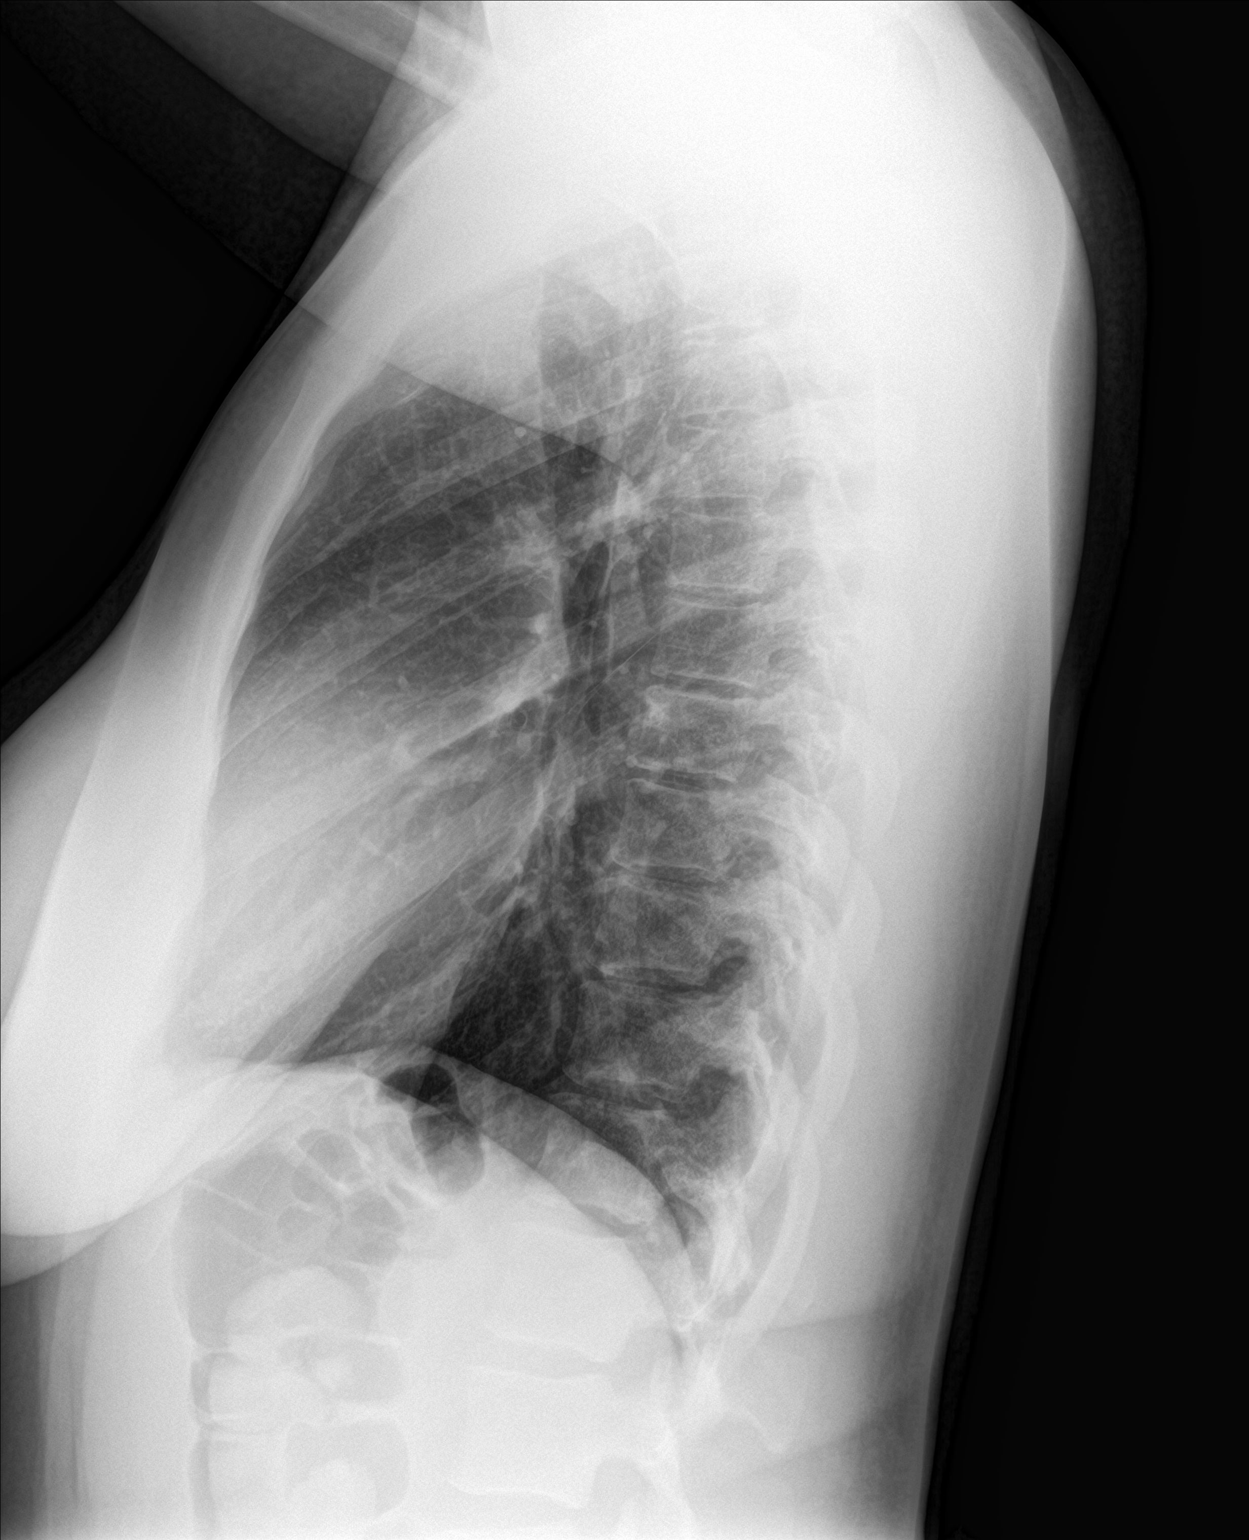

[2 of 2 positions shown; findings below may reference images not displayed]

FINDINGS: The heart size and mediastinal contours are within normal limits.
Both lungs are clear. The visualized skeletal structures are
unremarkable.
IMPRESSION: No active cardiopulmonary disease.

## 2022-08-11 ENCOUNTER — Ambulatory Visit: Payer: BC Managed Care – PPO

## 2022-08-25 ENCOUNTER — Ambulatory Visit: Payer: BC Managed Care – PPO | Admitting: Family Medicine

## 2022-08-26 ENCOUNTER — Ambulatory Visit: Payer: BC Managed Care – PPO | Admitting: Family Medicine

## 2022-08-31 NOTE — Assessment & Plan Note (Signed)
Supplement and monitor 

## 2022-08-31 NOTE — Assessment & Plan Note (Signed)
hgba1c acceptable, minimize simple carbs. Increase exercise as tolerated.  

## 2022-08-31 NOTE — Assessment & Plan Note (Signed)
No recent exacerbation 

## 2022-08-31 NOTE — Assessment & Plan Note (Signed)
-   Mild and asymptomatic 

## 2022-09-01 ENCOUNTER — Ambulatory Visit: Payer: BC Managed Care – PPO | Admitting: Family Medicine

## 2022-09-01 VITALS — BP 120/70 | HR 62 | Temp 98.0°F | Resp 16 | Ht 68.0 in | Wt 190.2 lb

## 2022-09-01 DIAGNOSIS — E559 Vitamin D deficiency, unspecified: Secondary | ICD-10-CM

## 2022-09-01 DIAGNOSIS — R739 Hyperglycemia, unspecified: Secondary | ICD-10-CM | POA: Diagnosis not present

## 2022-09-01 DIAGNOSIS — D649 Anemia, unspecified: Secondary | ICD-10-CM

## 2022-09-01 DIAGNOSIS — J4520 Mild intermittent asthma, uncomplicated: Secondary | ICD-10-CM | POA: Diagnosis not present

## 2022-09-01 DIAGNOSIS — E785 Hyperlipidemia, unspecified: Secondary | ICD-10-CM | POA: Insufficient documentation

## 2022-09-01 LAB — LIPID PANEL
Cholesterol: 184 mg/dL (ref 0–200)
HDL: 42.7 mg/dL (ref >39.00)
LDL Cholesterol: 127 mg/dL — ABNORMAL HIGH (ref 0–99)
NonHDL: 141.41
Total CHOL/HDL Ratio: 4
Triglycerides: 70 mg/dL (ref 0.0–149.0)
VLDL: 14 mg/dL (ref 0.0–40.0)

## 2022-09-01 LAB — CBC WITH DIFFERENTIAL/PLATELET
Basophils Absolute: 0 10*3/uL (ref 0.0–0.1)
Basophils Relative: 0.6 % (ref 0.0–3.0)
Eosinophils Absolute: 0.2 10*3/uL (ref 0.0–0.7)
Eosinophils Relative: 2.8 % (ref 0.0–5.0)
HCT: 36.2 % (ref 36.0–46.0)
Hemoglobin: 11.8 g/dL — ABNORMAL LOW (ref 12.0–15.0)
Lymphocytes Relative: 33.8 % (ref 12.0–46.0)
Lymphs Abs: 2.3 10*3/uL (ref 0.7–4.0)
MCHC: 32.7 g/dL (ref 30.0–36.0)
MCV: 87.6 fl (ref 78.0–100.0)
Monocytes Absolute: 0.5 10*3/uL (ref 0.1–1.0)
Monocytes Relative: 7.3 % (ref 3.0–12.0)
Neutro Abs: 3.8 10*3/uL (ref 1.4–7.7)
Neutrophils Relative %: 55.5 % (ref 43.0–77.0)
Platelets: 239 10*3/uL (ref 150.0–400.0)
RBC: 4.13 Mil/uL (ref 3.87–5.11)
RDW: 12.7 % (ref 11.5–15.5)
WBC: 6.8 10*3/uL (ref 4.0–10.5)

## 2022-09-01 LAB — COMPREHENSIVE METABOLIC PANEL
ALT: 12 U/L (ref 0–35)
AST: 23 U/L (ref 0–37)
Albumin: 4.7 g/dL (ref 3.5–5.2)
Alkaline Phosphatase: 80 U/L (ref 39–117)
BUN: 13 mg/dL (ref 6–23)
CO2: 29 meq/L (ref 19–32)
Calcium: 9.6 mg/dL (ref 8.4–10.5)
Chloride: 101 meq/L (ref 96–112)
Creatinine, Ser: 0.81 mg/dL (ref 0.40–1.20)
GFR: 102.9 mL/min (ref >60.00)
Glucose, Bld: 99 mg/dL (ref 70–99)
Potassium: 4.9 meq/L (ref 3.5–5.1)
Sodium: 137 meq/L (ref 135–145)
Total Bilirubin: 0.5 mg/dL (ref 0.2–1.2)
Total Protein: 7.9 g/dL (ref 6.0–8.3)

## 2022-09-01 LAB — HEMOGLOBIN A1C: Hgb A1c MFr Bld: 5.7 % (ref 4.6–6.5)

## 2022-09-01 NOTE — Assessment & Plan Note (Signed)
Encourage heart healthy diet such as MIND or DASH diet, increase exercise, avoid trans fats, simple carbohydrates and processed foods, consider a krill or fish or flaxseed oil cap daily.  °

## 2022-09-01 NOTE — Patient Instructions (Signed)
Try to use non GMO foods  Gluten-Free Diet   The gluten-free diet includes all foods that do not contain gluten. Gluten is a protein that is found in wheat, rye, barley, and some other grains. Following the gluten-free diet is the only treatment for people with celiac disease. It helps to prevent damage to the intestines and improves or eliminates the symptoms of celiac disease. Following the gluten-free diet requires some planning. It can be challenging at first, but it gets easier with time and practice. There are more gluten-free options available today than ever before. If you need help finding gluten-free foods or if you have questions, talk with your dietitian or health care provider. What are tips for following this plan? Reading food labels Read all food labels. Gluten is often added to foods. Always check the ingredient list and look for warnings that the food may contain gluten. Foods that list any of these key words on the label usually contain gluten: Wheat, flour, enriched flour, bromated flour, white flour, durum flour, graham flour, phosphated flour, self-rising flour, semolina, farina, barley (malt), rye, and oats. Starch, dextrin, modified food starch, or cereal. Thickening, fillers, or emulsifiers. Malt flavoring, malt extract, or malt syrup. Hydrolyzed vegetable protein. In the [place] packaged foods that are gluten-free are required to be labeled "GF." These foods should be easy to identify and are safe to eat. In the [place] food companies are also required to list common food allergens, including wheat, on their labels. Shopping When grocery shopping, start in the produce, meat, and dairy sections. These areas are more likely to contain gluten-free foods. Then move to the aisles that contain packaged foods if you need to. Meal planning All fruits, vegetables, and meats are safe to eat and do not contain gluten. Talk with your dietitian or health care provider before taking a  gluten-free multivitamin or mineral supplement. Be aware of gluten-free foods having contact with foods that contain gluten (cross-contamination). This can happen at home and with any processed foods. Talk with your health care provider or dietitian about how to reduce the risk of cross-contamination in your home. If you have questions about how a food is processed, ask the manufacturer. What foods can I eat? Fruits All plain fresh, frozen, canned, and dried fruits, and 100% fruit juices. Vegetables All plain fresh, frozen, and canned vegetables, and 100% vegetable juices. Grains Amaranth, bean flours, 100% buckwheat flour, corn, millet, nut flours or nut meals, GF oats, quinoa, rice, sorghum, teff, rice wafers, pure cornmeal tortillas, popcorn, and hot cereals made from cornmeal or GF grains. Hominy, rice, and wild rice. Some Asian rice noodles or bean noodles. Arrowroot starch, corn bran, corn flour, corn germ, cornmeal, corn starch, potato flour, potato starch flour, and rice bran. Plain, Zingg, and sweet rice flours. Rice polish, soy flour, and tapioca starch. Meats and other protein foods All fresh beef, pork, poultry, fish, seafood, and eggs. Fish canned in water, oil, brine, or vegetable broth. Plain nuts and seeds, peanut butter.  Some precooked or cured meat, such as sausages or meat loaves. Some frankfurters. Dried beans, dried peas, and lentils. Dairy Fresh plain, dry, evaporated, or condensed milk. Cream, butter, sour cream, whipping cream, and most yogurts. Unprocessed cheese, most processed cheeses, some cottage cheeses, and some cream cheeses. Beverages Coffee, tea, and most herbal teas. Carbonated beverages and some root beers. Wine, sake, and pure distilled spirits, such as gin, vodka, and whiskey. Most hard ciders. Fats and oils Butter, margarine, vegetable oil, hydrogenated  butter, olive oil, shortening, lard, cream, and some mayonnaise. Some commercial salad dressings.  Olives. Sweets and desserts Sugar, honey, some syrups, molasses, jelly, and jam. Plain hard candy, marshmallows, and gumdrops.  Pure cocoa powder. Plain chocolate. Custard and some pudding mixes. Gelatin desserts, sorbets, frozen ice pops, and sherbet.  Cake, cookies, and other desserts prepared with allowed flours. Some commercial ice creams. Cornstarch, tapioca, and rice puddings. Seasoning and other foods Some canned or frozen soups. Monosodium glutamate (MSG). Cider, rice, and wine vinegar. Baking soda and baking powder.  Cream of tartar. Baking and nutritional yeast. Certain soy sauces made without wheat. Ask your dietitian about specific brands that are allowed. Nuts, coconut, and chocolate. Salt, pepper, herbs, spices, flavoring extracts, imitation or artificial flavorings, natural flavorings, and food colorings.  Some medicines and supplements. Rice syrups. The items listed above may not be a complete list of foods and beverages you can eat and drink. Contact a dietitian for more information. What foods should I avoid? Fruits Thickened or prepared fruits and some pie fillings. Some fruit snacks and fruit roll-ups. Vegetables Most creamed vegetables and most vegetables canned in sauces. Some commercially prepared vegetables and salads. Vegetables in a soy sauce marinade or dressing. Grains Barley, bran, bulgur, couscous, cracked wheat, Pavo, farro, graham, malt, matzo, semolina, wheat germ, and all wheat and rye cereals, including spelt and kamut. Cereals containing malt as a flavoring, such as rice cereal. Noodles, spaghetti, macaroni, most packaged rice mixes, and all mixes containing wheat, rye, barley, or triticale. Meats and other protein foods Any meat or meat alternative containing wheat, rye, barley, or gluten stabilizers. These are often marinated or packaged meats, and precooked or cured meat, such as sausages or meat loaves. Bread-containing products, such as Swiss steak,  croquettes, meatballs, and meatloaf. Most tuna canned in vegetable broth. [place] with hydrolyzed vegetable protein (HVP) injected as part of the basting. Seitan. Imitation fish. Eggs in sauces made from ingredients to avoid. Dairy Commercial chocolate milk drinks and malted milk. Some non-dairy creamers. Any cheese product containing ingredients to avoid. Beverages Certain cereal beverages. Beer, ale, malted milk, and some root beers. Some hard ciders. Some instant flavored coffees. Some herbal teas made with barley or with barley malt added. Fats and oils Some commercial salad dressings. Sour cream containing modified food starch. Sweets and desserts Some toffees. Chocolate-coated nuts (may be rolled in wheat flour) and some commercial candies and candy bars.  Most cakes, cookies, donuts, pastries, and other baked goods. Some commercial ice cream. Ice cream cones.  Commercially prepared mixes for cakes, cookies, and other desserts. Bread pudding and other puddings thickened with flour.  Products containing [place] rice syrup made with barley malt enzyme. Desserts and sweets made with malt flavoring. Seasoning and other foods Some curry powders, some dry seasoning mixes, some gravy extracts, some meat sauces, some ketchups, some prepared mustards, and horseradish.  Certain soy sauces. Malt vinegar. Bouillon and bouillon cubes that contain HVP. Some chip dips. Some chewing gum. Yeast extract. Brewer's yeast. Caramel color.  Some medicines and supplements. The items listed above may not be a complete list of foods and beverages you should avoid. Contact a dietitian for more information. Summary Gluten is a protein that is found in wheat, rye, barley, and some other grains. The gluten-free diet includes all foods that do not contain gluten. If you need help finding gluten-free foods or if you have questions, talk with your dietitian or your health care provider. Read all food labels. Gluten  is often  added to foods. Always check the ingredient list and look for warnings that the food may contain gluten. This information is not intended to replace advice given to you by your health care provider. Make sure you discuss any questions you have with your health care provider. Document Revised: 11/17/2020 Document Reviewed: 11/17/2020 Elsevier Patient Education  2024 [employer].

## 2022-09-02 ENCOUNTER — Encounter: Payer: Self-pay | Admitting: Family Medicine

## 2022-09-02 LAB — TSH: TSH: 2.13 u[IU]/mL (ref 0.35–5.50)

## 2022-09-02 NOTE — Progress Notes (Signed)
Subjective:    Patient ID: Allison Campbell, female    DOB: 2000/07/09, 22 y.o.   MRN: [id number]  Chief Complaint  Patient presents with   Follow-up    Follow up    HPI Discussed the use of AI scribe software for clinical note transcription with the patient, who gave verbal consent to proceed.  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 They report feeling well and are in a good headspace. They have been taking vitamin D supplements and have noticed a slight hypoglycemia in previous blood work.  The patient has also noticed that certain breads, pastas, and tortillas cause headaches and upset their stomach. They do not have celiac disease, but suspect a possible gluten intolerance. They have been experimenting with a healthier diet, including couscous, quinoa, and basmati rice, and have noticed a positive difference. They have also stopped eating fast food and have been cooking at home daily.  The patient is proactive about their health due to their family history of various conditions. They are interested in preventing any potential health issues and are committed to maintaining a healthy lifestyle.        Past Medical History:  Diagnosis Date   Asthma    cough variant worse with infection   Atypical chest pain 02/10/2020   Mild intermittent asthma 07/06/2011   Tinea versicolor 02/10/2020    Past Surgical History:  Procedure Laterality Date   WISDOM TOOTH EXTRACTION      Family History  Problem Relation Age of Onset   Diabetes Maternal Grandfather    Stroke Paternal Grandmother    Edema Paternal Grandmother    Heart disease Paternal Grandfather    Thyroid disease Mother        thyroid cancer   Hypothyroidism Mother    Hypertension Father    Hypothyroidism Maternal Grandmother    Asthma Maternal Grandmother     Cancer Neg Hx     Social History   Socioeconomic History   Marital status: Single    Spouse name: Not on file   Number of children: Not on file   Years of education: Not on file   Highest education level: Bachelor's degree (e.g., BA, AB, BS)  Occupational History   Not on file  Tobacco Use   Smoking status: Never   Smokeless tobacco: Never  Vaping Use   Vaping status: Never Used  Substance and Sexual Activity   Alcohol use: Not Currently    Comment: Socially   Drug use: No   Sexual activity: Yes    Partners: Male    Birth control/protection: Condom    Comment: Few days ago  Other Topics Concern   Not on file  Social History Narrative   Lives with parents, along with 2 nephews.  2 siblings ([place] and [place]).  No passive tobacco exposure.   No dietary restrictions except no pork   [employer] as a Consulting civil engineer in Careers adviser, Programmer, applications at gym, yoga    Social Determinants of Health   Financial Resource Strain: Low Risk  (07/13/2022)   Overall Financial Resource Strain (CARDIA)    Difficulty of Paying Living Expenses: Not hard at all  Food Insecurity: No Food Insecurity (07/13/2022)   Hunger Vital Sign    Worried About Running Out of Food in the Last Year: Never true  Ran Out of Food in the Last Year: Never true  Transportation Needs: No Transportation Needs (07/13/2022)   PRAPARE - Administrator, Civil Service (Medical): No    Lack of Transportation (Non-Medical): No  Physical Activity: Insufficiently Active (07/13/2022)   Exercise Vital Sign    Days of Exercise per Week: 2 days    Minutes of Exercise per Session: 30 min  Stress: No Stress Concern Present (07/13/2022)   [employer] of Occupational Health - Occupational Stress Questionnaire    Feeling of Stress : Not at all  Social Connections: Moderately Integrated (07/13/2022)   Social Connection and Isolation Panel [NHANES]    Frequency of Communication with Friends and Family: More than  three times a week    Frequency of Social Gatherings with Friends and Family: Twice a week    Attends Religious Services: More than 4 times per year    Active Member of [employer] or Organizations: Yes    Attends Engineer, structural: More than 4 times per year    Marital Status: Never married  Catering manager Violence: Not on file    Outpatient Medications Prior to Visit  Medication Sig Dispense Refill   albuterol (PROVENTIL HFA;VENTOLIN HFA) 108 (90 Base) MCG/ACT inhaler Inhale 2 puffs into the lungs every 6 (six) hours as needed for wheezing or shortness of breath. Use 20-30 mins prior to exercise 18 g 0   brompheniramine-pseudoephedrine-DM 30-2-10 MG/5ML syrup Take 5 mLs by mouth 4 (four) times daily as needed. 120 mL 0   cetirizine (ZYRTEC) 10 MG chewable tablet Chew by mouth.     Drospirenone (SLYND) 4 MG TABS Take 1 tablet (4 mg total) by mouth daily. 56 tablet 0   montelukast (SINGULAIR) 10 MG tablet TAKE 1 TABLET BY MOUTH EVERYDAY AT BEDTIME 90 tablet 1   Multiple Vitamins-Minerals (MULTIVITAMIN WITH MINERALS) tablet Take 1 tablet by mouth daily.     omeprazole (PRILOSEC) 40 MG capsule Take 1 capsule (40 mg total) by mouth daily. 30 capsule 1   Vitamin D, Ergocalciferol, (DRISDOL) 1.25 MG (50000 UNIT) CAPS capsule Take 1 capsule (50,000 Units total) by mouth every 7 (seven) days. 5 capsule 4   fluconazole (DIFLUCAN) 150 MG tablet Take 150 mg by mouth every 3 (three) days.     No facility-administered medications prior to visit.    Allergies  Allergen Reactions   Amoxicillin    Penicillins Hives    Review of Systems  Constitutional:  Negative for fever and malaise/fatigue.  HENT:  Negative for congestion.   Eyes:  Negative for blurred vision.  Respiratory:  Negative for shortness of breath.   Cardiovascular:  Negative for chest pain, palpitations and leg swelling.  Gastrointestinal:  Negative for abdominal pain, blood in stool and nausea.  Genitourinary:  Negative  for dysuria and frequency.  Musculoskeletal:  Negative for falls.  Skin:  Negative for rash.  Neurological:  Negative for dizziness, loss of consciousness and headaches.  Endo/Heme/Allergies:  Negative for environmental allergies.  Psychiatric/Behavioral:  Negative for depression. The patient is not nervous/anxious.        Objective:    Physical Exam Constitutional:      General: She is not in acute distress.    Appearance: Normal appearance. She is well-developed. She is not toxic-appearing.  HENT:     Head: Normocephalic and atraumatic.     Right Ear: External ear normal.     Left Ear: External ear normal.     Nose: Nose normal.  Eyes:     General:        Right eye: No discharge.        Left eye: No discharge.     Conjunctiva/sclera: Conjunctivae normal.  Neck:     Thyroid: No thyromegaly.  Cardiovascular:     Rate and Rhythm: Normal rate and regular rhythm.     Heart sounds: Normal heart sounds. No murmur heard. Pulmonary:     Effort: Pulmonary effort is normal. No respiratory distress.     Breath sounds: Normal breath sounds.  Abdominal:     General: Bowel sounds are normal.     Palpations: Abdomen is soft.     Tenderness: There is no abdominal tenderness. There is no guarding.  Musculoskeletal:        General: Normal range of motion.     Cervical back: Neck supple.  Lymphadenopathy:     Cervical: No cervical adenopathy.  Skin:    General: Skin is warm and dry.  Neurological:     Mental Status: She is alert and oriented to person, place, and time.  Psychiatric:        Mood and Affect: Mood normal.        Behavior: Behavior normal.        Thought Content: Thought content normal.        Judgment: Judgment normal.     BP 120/70 (BP Location: Left Arm, Patient Position: Sitting, Cuff Size: Normal)   Pulse 62   Temp 98 F (36.7 C) (Oral)   Resp 16   Ht 5\' 8"  (1.727 m)   Wt 190 lb 3.2 oz (86.3 kg)   SpO2 99%   BMI 28.92 kg/m  Wt Readings from Last 3  Encounters:  09/01/22 190 lb 3.2 oz (86.3 kg)  07/22/22 194 lb (88 kg)  07/13/22 192 lb 6.4 oz (87.3 kg)    Diabetic Foot Exam - Simple   No data filed    Lab Results  Component Value Date   WBC 6.8 09/01/2022   HGB 11.8 (L) 09/01/2022   HCT 36.2 09/01/2022   PLT 239.0 09/01/2022   GLUCOSE 99 09/01/2022   CHOL 184 09/01/2022   TRIG 70.0 09/01/2022   HDL 42.70 09/01/2022   LDLCALC 127 (H) 09/01/2022   ALT 12 09/01/2022   AST 23 09/01/2022   NA 137 09/01/2022   K 4.9 09/01/2022   CL 101 09/01/2022   CREATININE 0.81 09/01/2022   BUN 13 09/01/2022   CO2 29 09/01/2022   TSH 2.13 09/01/2022   HGBA1C 5.7 09/01/2022    Lab Results  Component Value Date   TSH 2.13 09/01/2022   Lab Results  Component Value Date   WBC 6.8 09/01/2022   HGB 11.8 (L) 09/01/2022   HCT 36.2 09/01/2022   MCV 87.6 09/01/2022   PLT 239.0 09/01/2022   Lab Results  Component Value Date   NA 137 09/01/2022   K 4.9 09/01/2022   CO2 29 09/01/2022   GLUCOSE 99 09/01/2022   BUN 13 09/01/2022   CREATININE 0.81 09/01/2022   BILITOT 0.5 09/01/2022   ALKPHOS 80 09/01/2022   AST 23 09/01/2022   ALT 12 09/01/2022   PROT 7.9 09/01/2022   ALBUMIN 4.7 09/01/2022   CALCIUM 9.6 09/01/2022   ANIONGAP 7 07/30/2021   GFR 102.90 09/01/2022   Lab Results  Component Value Date   CHOL 184 09/01/2022   Lab Results  Component Value Date   HDL 42.70 09/01/2022   Lab Results  Component Value Date   LDLCALC 127 (H) 09/01/2022   Lab Results  Component Value Date   TRIG 70.0 09/01/2022   Lab Results  Component Value Date   CHOLHDL 4 09/01/2022   Lab Results  Component Value Date   HGBA1C 5.7 09/01/2022       Assessment & Plan:  Hyperglycemia Assessment & Plan: hgba1c acceptable, minimize simple carbs. Increase exercise as tolerated.   Orders: -     Lipid panel -     Comprehensive metabolic panel -     Hemoglobin A1c -     TSH  Vitamin D deficiency Assessment & Plan: Supplement and  monitor   Orders: -     Vitamin D 1,25 dihydroxy  Mild intermittent asthma without complication Assessment & Plan: No recent exacerbation   Anemia, unspecified type Assessment & Plan: Mild and asymptomatic  Orders: -     CBC with Differential/Platelet  Hyperlipidemia, unspecified hyperlipidemia type Assessment & Plan: Encourage heart healthy diet such as MIND or DASH diet, increase exercise, avoid trans fats, simple carbohydrates and processed foods, consider a krill or fish or flaxseed oil cap daily.       Assessment and Plan    Possible Gluten Intolerance Reports discomfort and headaches after consuming certain types of bread and pasta. No diagnosis of celiac disease. Discussed potential for non-celiac gluten sensitivity and the impact of food quality and manufacturing processes on digestion. -Trial of avoiding gluten-containing foods and opting for non-GMO products. -Continue with current dietary changes including consumption of couscous, quinoa, and basmati rice.  Vitamin D Deficiency Patient has been compliant with Vitamin D supplementation. -Continue Vitamin D supplementation.  General Health Maintenance Patient is moving to a new location and has a potential risk of exposure to COVID-19 and influenza. -Recommended to receive COVID-19 booster and influenza vaccine in the fall, potentially separated by two weeks.  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 -Schedule routine check-up for 2025, to be cancelled if patient does not return to the area.         Danise Edge, MD

## 2022-09-04 LAB — VITAMIN D 1,25 DIHYDROXY
Vitamin D 1, 25 (OH)2 Total: 36 pg/mL (ref 18–72)
Vitamin D2 1, 25 (OH)2: 19 pg/mL
Vitamin D3 1, 25 (OH)2: 17 pg/mL

## 2022-12-05 ENCOUNTER — Ambulatory Visit: Payer: BC Managed Care – PPO | Admitting: Obstetrics and Gynecology

## 2023-01-27 ENCOUNTER — Other Ambulatory Visit: Payer: Self-pay | Admitting: Family Medicine

## 2023-01-27 NOTE — Telephone Encounter (Signed)
Copied from CRM [id number]. Topic: Clinical - Medication Refill >> Jan 27, 2023 12:57 PM Lennart Pall wrote: Most Recent Primary Care Visit:  Provider: Danise Edge A  Department: [facility]  Visit Type: OFFICE VISIT  Date: 09/01/2022  Medication: Vitamin D, Ergocalciferol,  Has the patient contacted their pharmacy? Yes (Agent: If no, request that the patient contact the pharmacy for the refill. If patient does not wish to contact the pharmacy document the reason why and proceed with request.) (Agent: If yes, when and what did the pharmacy advise?)  Is this the correct pharmacy for this prescription? Yes If no, delete pharmacy and type the correct one.  This is the patient's preferred pharmacy:  [facility] [phone], [place] - [address] [place] [address] Phone: [phone] Fax: [phone]    Has the prescription been filled recently? Yes  Is the patient out of the medication? Yes  Has the patient been seen for an appointment in the last year OR does the patient have an upcoming appointment? Yes  Can we respond through MyChart? Yes  Agent: Please be advised that Rx refills may take up to 3 business days. We ask that you follow-up with your pharmacy.

## 2023-03-14 ENCOUNTER — Other Ambulatory Visit: Payer: Self-pay | Admitting: Family Medicine

## 2023-03-14 DIAGNOSIS — J45990 Exercise induced bronchospasm: Secondary | ICD-10-CM

## 2023-03-14 NOTE — Telephone Encounter (Signed)
 Copied from CRM [id number]. Topic: Clinical - Medication Refill >> Mar 14, 2023  3:20 PM [place] J wrote: Most Recent Primary Care Visit:  Provider: Danise Edge A  Department: [facility]  Visit Type: OFFICE VISIT  Date: 09/01/2022  Medication: albuterol (PROVENTIL HFA;VENTOLIN HFA) 108 (90 Base) MCG/ACT inhaler  Has the patient contacted their pharmacy? Yes (Agent: If no, request that the patient contact the pharmacy for the refill. If patient does not wish to contact the pharmacy document the reason why and proceed with request.) (Agent: If yes, when and what did the pharmacy advise?)  Is this the correct pharmacy for this prescription? Yes If no, delete pharmacy and type the correct one.  This is the patient's preferred pharmacy:   [facility] [place], [place] - [address] [place], [place], [place] [address] Phone: [phone]  [phone] [place], [place] - [address] [place] [address] Phone: [phone] Fax: [phone]  [id number] [place], [place] - [address] [place] [address] Phone: [phone] Fax: [phone]   Has the prescription been filled recently? No  Is the patient out of the medication? Yes  Has the patient been seen for an appointment in the last year OR does the patient have an upcoming appointment? Yes  Can we respond through MyChart? Yes  Agent: Please be advised that Rx refills may take up to 3 business days. We ask that you follow-up with your pharmacy.

## 2023-03-14 NOTE — Telephone Encounter (Signed)
 Last Fill: 12/17/16  Last OV: 09/01/22  Next OV: None Scheduled  Routing to provider for review/authorization.

## 2023-03-15 MED ORDER — ALBUTEROL SULFATE HFA 108 (90 BASE) MCG/ACT IN AERS
2.0000 | INHALATION_SPRAY | Freq: Four times a day (QID) | RESPIRATORY_TRACT | 0 refills | Status: DC | PRN
Start: 2023-03-15 — End: 2023-04-18

## 2023-03-27 ENCOUNTER — Ambulatory Visit
Admission: EM | Admit: 2023-03-27 | Discharge: 2023-03-27 | Disposition: A | Discharge status: Home / Self Care | Attending: Emergency Medicine | Admitting: Emergency Medicine

## 2023-03-27 DIAGNOSIS — Z3202 Encounter for pregnancy test, result negative: Secondary | ICD-10-CM | POA: Diagnosis not present

## 2023-03-27 DIAGNOSIS — Z760 Encounter for issue of repeat prescription: Secondary | ICD-10-CM

## 2023-03-27 DIAGNOSIS — J4521 Mild intermittent asthma with (acute) exacerbation: Secondary | ICD-10-CM

## 2023-03-27 LAB — POCT URINE PREGNANCY: Preg Test, Ur: NEGATIVE

## 2023-03-27 MED ORDER — MONTELUKAST SODIUM 10 MG PO TABS
10.0000 mg | ORAL_TABLET | Freq: Every day | ORAL | 0 refills | Status: AC
Start: 2023-03-27 — End: ?

## 2023-03-27 MED ORDER — PREDNISONE 10 MG PO TABS
40.0000 mg | ORAL_TABLET | Freq: Every day | ORAL | 0 refills | Status: AC
Start: 2023-03-27 — End: 2023-04-01

## 2023-03-27 NOTE — ED Triage Notes (Addendum)
 Patient to Urgent Care with complaints of nasal congestion/ sinus pain and pressure/ lymph node pain and shortness of breath. Dry cough. Denies any known fevers.   Symptoms started Friday. Started feeling worse over night.   Meds: Mucinex. Would like a refil of Singulair.

## 2023-03-27 NOTE — Discharge Instructions (Addendum)
 Use your albuterol inhaler as directed.  Take the prednisone and Singulair as directed.  Follow up with your primary care provider tomorrow.  Go to the emergency department if you have worsening symptoms.

## 2023-03-27 NOTE — ED Provider Notes (Signed)
 Allison Campbell    CSN: [id number] Arrival date & time: 03/27/23  1603      History   Chief Complaint Chief Complaint  Patient presents with   Nasal Congestion   Shortness of Breath    HPI Allison Campbell is a 23 y.o. female.  Patient presents with congestion, postnasal drip, sinus pressure, cough x 3 days.  She reports wheezing and mild shortness of breath x 1 day.  She was out of town and did not have her albuterol inhaler with her so she treated her symptoms with dandelion tea.  No fever, earache, sore throat.  Her medical history includes mild intermittent asthma.  Patient requests refill of Singulair.  The history is provided by the patient and medical records.    Past Medical History:  Diagnosis Date   Asthma    cough variant worse with infection   Atypical chest pain 02/10/2020   Mild intermittent asthma 07/06/2011   Tinea versicolor 02/10/2020    Patient Active Problem List   Diagnosis Date Noted   Hyperlipidemia 09/01/2022   Chronic constipation 07/13/2022   Hyperglycemia 05/08/2022   Acute pancreatitis 01/13/2022   Left upper quadrant abdominal pain 01/11/2022   GERD (gastroesophageal reflux disease) 01/11/2022   Left shoulder pain 01/11/2022   Allergies 09/10/2021   PTSD (post-traumatic stress disorder) 06/11/2021   Vitamin D deficiency 12/30/2020   Anemia 12/29/2020   Encounter for contraceptive management 12/29/2020   Tinea versicolor 02/10/2020   Preventative health care 02/10/2020   Enlarged thyroid 02/10/2020   Mild intermittent asthma 07/06/2011    Past Surgical History:  Procedure Laterality Date   WISDOM TOOTH EXTRACTION      OB History     Gravida  0   Para  0   Term  0   Preterm  0   AB  0   Living  0      SAB  0   IAB  0   Ectopic  0   Multiple  0   Live Births  0            Home Medications    Prior to Admission medications   Medication Sig Start Date End Date Taking? Authorizing Provider  albuterol  (VENTOLIN HFA) 108 (90 Base) MCG/ACT inhaler Inhale 2 puffs into the lungs every 6 (six) hours as needed for wheezing or shortness of breath. Use 20-30 mins prior to exercise 03/15/23   Worthy Rancher B, FNP  brompheniramine-pseudoephedrine-DM 30-2-10 MG/5ML syrup Take 5 mLs by mouth 4 (four) times daily as needed. Patient not taking: Reported on 03/27/2023 08/02/22   Mickie Bail, NP  cetirizine (ZYRTEC) 10 MG chewable tablet Chew by mouth.    [provider]  Drospirenone (SLYND) 4 MG TABS Take 1 tablet (4 mg total) by mouth daily. Patient not taking: Reported on 03/27/2023 03/01/22   Anyanwu, Jethro Bastos, MD  montelukast (SINGULAIR) 10 MG tablet Take 1 tablet (10 mg total) by mouth at bedtime. 03/27/23  Yes Mickie Bail, NP  Multiple Vitamins-Minerals (MULTIVITAMIN WITH MINERALS) tablet Take 1 tablet by mouth daily. 02/11/22   [name], NP  omeprazole (PRILOSEC) 40 MG capsule Take 1 capsule (40 mg total) by mouth daily. 01/11/22   [name], NP  predniSONE (DELTASONE) 10 MG tablet Take 4 tablets (40 mg total) by mouth daily for 5 days. 03/27/23 04/01/23 Yes Mickie Bail, NP  Vitamin D, Ergocalciferol, (DRISDOL) 1.25 MG (50000 UNIT) CAPS capsule Take 1 capsule (50,000 Units  total) by mouth every 7 (seven) days. 05/19/22   Bradd Canary, MD    Family History Family History  Problem Relation Age of Onset   Diabetes Maternal Grandfather    Stroke Paternal Grandmother    Edema Paternal Grandmother    Heart disease Paternal Grandfather    Thyroid disease Mother        thyroid cancer   Hypothyroidism Mother    Hypertension Father    Hypothyroidism Maternal Grandmother    Asthma Maternal Grandmother    Cancer Neg Hx     Social History Social History   Tobacco Use   Smoking status: Never   Smokeless tobacco: Never  Vaping Use   Vaping status: Never Used  Substance Use Topics   Alcohol use: Yes    Comment: Socially   Drug use: No     Allergies   Amoxicillin and  Penicillins   Review of Systems Review of Systems  Constitutional:  Negative for chills and fever.  HENT:  Positive for congestion. Negative for ear pain and sore throat.   Respiratory:  Positive for cough, shortness of breath and wheezing.      Physical Exam Triage Vital Signs ED Triage Vitals  Encounter Vitals Group     BP 03/27/23 1705 114/75     Systolic BP Percentile --      Diastolic BP Percentile --      Pulse Rate 03/27/23 1705 69     Resp 03/27/23 1705 18     Temp 03/27/23 1705 98.3 F (36.8 C)     Temp src --      SpO2 03/27/23 1705 97 %     Weight --      Height --      Head Circumference --      Peak Flow --      Pain Score 03/27/23 1708 8     Pain Loc --      Pain Education --      Exclude from Growth Chart --    No data found.  Updated Vital Signs BP 114/75   Pulse 69   Temp 98.3 F (36.8 C)   Resp 18   LMP 02/27/2023 (Approximate)   SpO2 97%   Visual Acuity Right Eye Distance:   Left Eye Distance:   Bilateral Distance:    Right Eye Near:   Left Eye Near:    Bilateral Near:     Physical Exam Constitutional:      General: She is not in acute distress. HENT:     Right Ear: Tympanic membrane normal.     Left Ear: Tympanic membrane normal.     Nose: Nose normal.     Mouth/Throat:     Mouth: Mucous membranes are moist.     Pharynx: Oropharynx is clear.     Comments: Clear PND Cardiovascular:     Rate and Rhythm: Normal rate and regular rhythm.     Heart sounds: Normal heart sounds.  Pulmonary:     Effort: Pulmonary effort is normal. No respiratory distress.     Breath sounds: Normal breath sounds. No wheezing.  Neurological:     Mental Status: She is alert.      UC Treatments / Results  Labs (all labs ordered are listed, but only abnormal results are displayed) Labs Reviewed  POCT URINE PREGNANCY    EKG   Radiology No results found.  Procedures Procedures (including critical care time)  Medications Ordered in  UC Medications -  No data to display  Initial Impression / Assessment and Plan / UC Course  I have reviewed the triage vital signs and the nursing notes.  Pertinent labs & imaging results that were available during my care of the patient were reviewed by me and considered in my medical decision making (see chart for details).   Asthma exacerbation, medication refill, negative pregnancy test.  Treating today with prednisone.  Instructed patient to use her albuterol inhaler as directed.  30-day supply of Singulair sent to pharmacy also.  Instructed her to follow-up with her PCP tomorrow.  ED precautions given.  Education provided on asthma.  She agrees to plan of care.   Final Clinical Impressions(s) / UC Diagnoses   Final diagnoses:  Mild intermittent asthma with acute exacerbation  Medication refill  Negative pregnancy test     Discharge Instructions      Use your albuterol inhaler as directed.  Take the prednisone and Singulair as directed.  Follow up with your primary care provider tomorrow.  Go to the emergency department if you have worsening symptoms.        ED Prescriptions     Medication Sig Dispense Auth. Provider   predniSONE (DELTASONE) 10 MG tablet Take 4 tablets (40 mg total) by mouth daily for 5 days. 20 tablet Mickie Bail, NP   montelukast (SINGULAIR) 10 MG tablet Take 1 tablet (10 mg total) by mouth at bedtime. 30 tablet Mickie Bail, NP      PDMP not reviewed this encounter.   Mickie Bail, NP 03/27/23 [phone]

## 2023-04-18 ENCOUNTER — Other Ambulatory Visit: Payer: Self-pay | Admitting: Family Medicine

## 2023-04-18 DIAGNOSIS — J45990 Exercise induced bronchospasm: Secondary | ICD-10-CM

## 2023-04-18 MED ORDER — ALBUTEROL SULFATE HFA 108 (90 BASE) MCG/ACT IN AERS: 2.0000 | INHALATION_SPRAY | Freq: Four times a day (QID) | RESPIRATORY_TRACT | 0 refills | Status: DC | PRN

## 2023-04-18 NOTE — Telephone Encounter (Signed)
 Copied from CRM [id number]. Topic: Clinical - Medication Refill >> Apr 18, 2023 11:09 AM Deaijah H wrote: Most Recent Primary Care Visit:  Provider: Danise Edge A  Department: [facility]  Visit Type: OFFICE VISIT  Date: 09/01/2022  Medication: albuterol (VENTOLIN HFA) 108 (90 Base) MCG/ACT inhaler    Has the patient contacted their pharmacy? No (Agent: If no, request that the patient contact the pharmacy for the refill. If patient does not wish to contact the pharmacy document the reason why and proceed with request.) (Agent: If yes, when and what did the pharmacy advise?)  Is this the correct pharmacy for this prescription? Yes If no, delete pharmacy and type the correct one.  This is the patient's preferred pharmacy:  [id number] [address] [place], [place] [address] Phone: [phone]   Has the prescription been filled recently? Yes  Is the patient out of the medication? Yes  Has the patient been seen for an appointment in the last year OR does the patient have an upcoming appointment? Yes  Can we respond through MyChart? Yes  Agent: Please be advised that Rx refills may take up to 3 business days. We ask that you follow-up with your pharmacy.

## 2023-04-18 NOTE — Telephone Encounter (Signed)
 Last Fill: 03/15/23 18 g/0 RF  Last OV: 09/01/22 Next OV: 02/22/24  Routing to provider for review/authorization.

## 2023-04-25 ENCOUNTER — Ambulatory Visit (HOSPITAL_BASED_OUTPATIENT_CLINIC_OR_DEPARTMENT_OTHER)
Admission: RE | Admit: 2023-04-25 | Discharge: 2023-04-25 | Disposition: A | Discharge status: Home / Self Care | Source: Ambulatory Visit | Attending: Family Medicine | Admitting: Family Medicine

## 2023-04-25 ENCOUNTER — Ambulatory Visit: Payer: Self-pay

## 2023-04-25 ENCOUNTER — Ambulatory Visit: Admitting: Family Medicine

## 2023-04-25 VITALS — BP 100/82 | HR 82 | Temp 98.1°F | Resp 18 | Ht 68.0 in | Wt 198.2 lb

## 2023-04-25 DIAGNOSIS — J4541 Moderate persistent asthma with (acute) exacerbation: Secondary | ICD-10-CM | POA: Insufficient documentation

## 2023-04-25 MED ORDER — BENZONATATE 100 MG PO CAPS: ORAL_CAPSULE | ORAL | 0 refills | Status: AC

## 2023-04-25 MED ORDER — BUDESONIDE-FORMOTEROL FUMARATE 160-4.5 MCG/ACT IN AERO: 2.0000 | INHALATION_SPRAY | Freq: Two times a day (BID) | RESPIRATORY_TRACT | 3 refills | Status: AC

## 2023-04-25 MED ORDER — ALBUTEROL SULFATE (2.5 MG/3ML) 0.083% IN NEBU: 2.5000 mg | INHALATION_SOLUTION | Freq: Four times a day (QID) | RESPIRATORY_TRACT | 1 refills | Status: AC | PRN

## 2023-04-25 MED ORDER — METHYLPREDNISOLONE ACETATE 80 MG/ML IJ SUSP
80.0000 mg | Freq: Once | INTRAMUSCULAR | Status: AC
  Administered 2023-04-25: 80 mg via INTRAMUSCULAR

## 2023-04-25 MED ORDER — PREDNISONE 10 MG PO TABS
ORAL_TABLET | ORAL | 0 refills | Status: DC
Start: 2023-04-25 — End: 2023-07-01

## 2023-04-25 NOTE — Telephone Encounter (Signed)
 Copied from CRM [id number]. Topic: Clinical - Red Word Triage >> Apr 25, 2023 11:10 AM Mackie Pai E wrote: [facility] that prompted transfer to Nurse Triage: Breathing issues with chest pain. Patient has had some breathing issues for the past couple days, gets worse at night. Patient stated this is leading to chest pain which she noticed last night.  Chief Complaint: Cough Symptoms: Mild SOB, congestion Frequency: About a week Pertinent Negatives: Patient denies wheezing Disposition: [] ED /[] Urgent Care (no appt availability in office) / [x] Appointment(In office/virtual)/ []  [facility]/ [] Home Care/ [] Refused Recommended Disposition /[]  Mobile Bus/ []  Follow-up with PCP Additional Notes: Patient called in to report a productive cough that started about a week ago. Patient also reported mild SOB and nasal congestion. Patient stated cough produces clear or green phlegm. Patient stated coughing spells cause her to feel SOB. Patient denied SOB at this time. Patient able to speak in clear and complete sentences while on phone with this RN. No labored breathing detected. Advised patient to be seen within 24 hours, per protocol. Provided care advice and instructed patient to call back if symptoms worsen. Patient complied.   Reason for Disposition  [1] MILD difficulty breathing (e.g., minimal/no SOB at rest, SOB with walking, pulse <100) AND [2] still present when not coughing  Answer Assessment - Initial Assessment Questions 1. ONSET: "When did the cough begin?"      1-2 weeks ago 2. SEVERITY: "How bad is the cough today?"      States coughing spells get very bad at night, states she feels like she could not breathe at times  3. SPUTUM: "Describe the color of your sputum" (none, dry cough; clear, white, yellow, green)     Clear or green 4. HEMOPTYSIS: "Are you coughing up any blood?" If so ask: "How much?" (flecks, streaks, tablespoons, etc.)     Twice, little specks, states she  can tell it came from her sinuses 5. DIFFICULTY BREATHING: "Are you having difficulty breathing?" If Yes, ask: "How bad is it?" (e.g., mild, moderate, severe)    - MILD: No SOB at rest, mild SOB with walking, speaks normally in sentences, can lie down, no retractions, pulse < 100.    - MODERATE: SOB at rest, SOB with minimal exertion and prefers to sit, cannot lie down flat, speaks in phrases, mild retractions, audible wheezing, pulse 100-120.    - SEVERE: Very SOB at rest, speaks in single words, struggling to breathe, sitting hunched forward, retractions, pulse > 120      Denies SOB at this time, but stays SOB onsets at night 6. FEVER: "Do you have a fever?" If Yes, ask: "What is your temperature, how was it measured, and when did it start?"     States she has felt hot, maybe a low grade fever 7. CARDIAC HISTORY: "Do you have any history of heart disease?" (e.g., heart attack, congestive heart failure)      Denies 8. LUNG HISTORY: "Do you have any history of lung disease?"  (e.g., pulmonary embolus, asthma, emphysema)     Asthma 9. PE RISK FACTORS: "Do you have a history of blood clots?" (or: recent major surgery, recent prolonged travel, bedridden)     Denies 10. OTHER SYMPTOMS: "Do you have any other symptoms?" (e.g., runny nose, wheezing, chest pain)     Nasal congestion, denies wheezing  Protocols used: Cough - Acute Productive-A-AH

## 2023-04-25 NOTE — Progress Notes (Signed)
 Established Patient Office Visit  Subjective   Patient ID: Allison Campbell, female    DOB: Dec 20, 2000  Age: 23 y.o. MRN: [id number]  Chief Complaint  Patient presents with   Cough    Pt states still having sxs since UC visit in March. Seems to be getting worse.     HPI Discussed the use of AI scribe software for clinical note transcription with the patient, who gave verbal consent to proceed.  History of Present Illness The patient, with asthma, presents with exacerbation of asthma symptoms.  Her asthma symptoms have worsened significantly after attending an outdoor festival two weekends ago, described as 'amplified like times ten,' with increased wheezing and hard coughing, particularly at night. She experienced an asthma attack last night, characterized by difficulty catching her breath and persistent coughing leading to headaches.  She visited urgent care in [place] on May 13th due to sinus symptoms and was prescribed Singulair. However, she feels that the Singulair is not effective, as she continues to experience severe coughing spells, especially at night. She uses an albuterol inhaler, which is her only inhaler at the moment. Her mother notes that she was not given Tessalon Perles at the urgent care, which she used to take for her cough.  She coughs up phlegm that is sometimes green and sometimes gray. No significant fevers, only low-grade fevers that subside quickly. She lives in [place], which is near [place].  Her current medications include Singulair and albuterol inhaler. She recently completed a course of prednisone in March, taking four pills a day for five days.   Patient Active Problem List   Diagnosis Date Noted   Hyperlipidemia 09/01/2022   Chronic constipation 07/13/2022   Hyperglycemia 05/08/2022   Acute pancreatitis 01/13/2022   Left upper quadrant abdominal pain 01/11/2022   GERD (gastroesophageal reflux disease) 01/11/2022   Left shoulder pain  01/11/2022   Allergies 09/10/2021   PTSD (post-traumatic stress disorder) 06/11/2021   Vitamin D deficiency 12/30/2020   Anemia 12/29/2020   Encounter for contraceptive management 12/29/2020   Tinea versicolor 02/10/2020   Preventative health care 02/10/2020   Enlarged thyroid 02/10/2020   Mild intermittent asthma 07/06/2011   Past Medical History:  Diagnosis Date   Asthma    cough variant worse with infection   Atypical chest pain 02/10/2020   Mild intermittent asthma 07/06/2011   Tinea versicolor 02/10/2020   Past Surgical History:  Procedure Laterality Date   WISDOM TOOTH EXTRACTION     Social History   Tobacco Use   Smoking status: Never   Smokeless tobacco: Never  Vaping Use   Vaping status: Never Used  Substance Use Topics   Alcohol use: Yes    Comment: Socially   Drug use: No   Social History   Socioeconomic History   Marital status: Single    Spouse name: Not on file   Number of children: Not on file   Years of education: Not on file   Highest education level: Bachelor's degree (e.g., BA, AB, BS)  Occupational History   Not on file  Tobacco Use   Smoking status: Never   Smokeless tobacco: Never  Vaping Use   Vaping status: Never Used  Substance and Sexual Activity   Alcohol use: Yes    Comment: Socially   Drug use: No   Sexual activity: Yes    Partners: Male    Birth control/protection: Condom    Comment: Few days ago  Other Topics Concern   Not on file  Social History Narrative   Lives with parents, along with 2 nephews.  2 siblings (Cary and Friedens).  No passive tobacco exposure.   No dietary restrictions except no pork   [employer] as a Consulting civil engineer in Careers adviser, Programmer, applications at gym, yoga    Social Drivers of Health   Financial Resource Strain: Low Risk  (04/25/2023)   Overall Financial Resource Strain (CARDIA)    Difficulty of Paying Living Expenses: Not hard at all  Food Insecurity: No Food Insecurity (04/25/2023)   Hunger  Vital Sign    Worried About Running Out of Food in the Last Year: Never true    Ran Out of Food in the Last Year: Never true  Transportation Needs: No Transportation Needs (04/25/2023)   PRAPARE - Administrator, Civil Service (Medical): No    Lack of Transportation (Non-Medical): No  Physical Activity: Insufficiently Active (04/25/2023)   Exercise Vital Sign    Days of Exercise per Week: 3 days    Minutes of Exercise per Session: 40 min  Stress: No Stress Concern Present (04/25/2023)   [employer] of Occupational Health - Occupational Stress Questionnaire    Feeling of Stress : Only a little  Social Connections: Moderately Integrated (04/25/2023)   Social Connection and Isolation Panel [NHANES]    Frequency of Communication with Friends and Family: More than three times a week    Frequency of Social Gatherings with Friends and Family: Three times a week    Attends Religious Services: More than 4 times per year    Active Member of Clubs or Organizations: Yes    Attends Engineer, structural: More than 4 times per year    Marital Status: Never married  Intimate Partner Violence: Not on file   Family Status  Relation Name Status   MGF 73 Alive   PGM 85 Deceased   PGF 61 Deceased   Mother 9 Alive   Father 31 Alive   Sister Claire,30 Alive   MGM 34 Alive   Sister Kia,25 Alive   Neg Hx  (Not Specified)  No partnership data on file   Family History  Problem Relation Age of Onset   Diabetes Maternal Grandfather    Stroke Paternal Grandmother    Edema Paternal Grandmother    Heart disease Paternal Grandfather    Thyroid disease Mother        thyroid cancer   Hypothyroidism Mother    Hypertension Father    Hypothyroidism Maternal Grandmother    Asthma Maternal Grandmother    Cancer Neg Hx    Allergies  Allergen Reactions   Amoxicillin    Penicillins Hives      Review of Systems  Constitutional:  Negative for fever and malaise/fatigue.  HENT:   Negative for congestion.   Eyes:  Negative for blurred vision.  Respiratory:  Positive for cough, sputum production and wheezing. Negative for shortness of breath.   Cardiovascular:  Negative for chest pain, palpitations and leg swelling.  Gastrointestinal:  Negative for vomiting.  Musculoskeletal:  Negative for back pain.  Skin:  Negative for rash.  Neurological:  Negative for loss of consciousness and headaches.      Objective:     BP 100/82 (BP Location: Left Arm, Patient Position: Sitting, Cuff Size: Normal)   Pulse 82   Temp 98.1 F (36.7 C) (Oral)   Resp 18   Ht 5\' 8"  (1.727 m)   Wt 198 lb 3.2 oz (89.9 kg)  LMP 02/27/2023 (Approximate)   SpO2 97%   BMI 30.14 kg/m  BP Readings from Last 3 Encounters:  04/25/23 100/82  03/27/23 114/75  09/01/22 120/70   Wt Readings from Last 3 Encounters:  04/25/23 198 lb 3.2 oz (89.9 kg)  09/01/22 190 lb 3.2 oz (86.3 kg)  07/22/22 194 lb (88 kg)   SpO2 Readings from Last 3 Encounters:  04/25/23 97%  03/27/23 97%  09/01/22 99%      Physical Exam Vitals and nursing note reviewed.  Constitutional:      General: She is not in acute distress.    Appearance: Normal appearance. She is well-developed.  HENT:     Head: Normocephalic and atraumatic.  Eyes:     General: No scleral icterus.       Right eye: No discharge.        Left eye: No discharge.  Cardiovascular:     Rate and Rhythm: Normal rate and regular rhythm.     Heart sounds: No murmur heard. Pulmonary:     Effort: Pulmonary effort is normal. No respiratory distress.     Breath sounds: Decreased breath sounds and wheezing present.  Musculoskeletal:        General: Normal range of motion.     Cervical back: Normal range of motion and neck supple.     Right lower leg: No edema.     Left lower leg: No edema.  Skin:    General: Skin is warm and dry.  Neurological:     Mental Status: She is alert and oriented to person, place, and time.  Psychiatric:        Mood  and Affect: Mood normal.        Behavior: Behavior normal.        Thought Content: Thought content normal.        Judgment: Judgment normal.      No results found for any visits on 04/25/23.  Last CBC Lab Results  Component Value Date   WBC 6.8 09/01/2022   HGB 11.8 (L) 09/01/2022   HCT 36.2 09/01/2022   MCV 87.6 09/01/2022   MCH 29.8 07/30/2021   RDW 12.7 09/01/2022   PLT 239.0 09/01/2022   Last metabolic panel Lab Results  Component Value Date   GLUCOSE 99 09/01/2022   NA 137 09/01/2022   K 4.9 09/01/2022   CL 101 09/01/2022   CO2 29 09/01/2022   BUN 13 09/01/2022   CREATININE 0.81 09/01/2022   GFR 102.90 09/01/2022   CALCIUM 9.6 09/01/2022   PROT 7.9 09/01/2022   ALBUMIN 4.7 09/01/2022   BILITOT 0.5 09/01/2022   ALKPHOS 80 09/01/2022   AST 23 09/01/2022   ALT 12 09/01/2022   ANIONGAP 7 07/30/2021   Last lipids Lab Results  Component Value Date   CHOL 184 09/01/2022   HDL 42.70 09/01/2022   LDLCALC 127 (H) 09/01/2022   TRIG 70.0 09/01/2022   CHOLHDL 4 09/01/2022   Last hemoglobin A1c Lab Results  Component Value Date   HGBA1C 5.7 09/01/2022   Last thyroid functions Lab Results  Component Value Date   TSH 2.13 09/01/2022   Last vitamin D Lab Results  Component Value Date   VD25OH 14.21 (L) 05/17/2022   Last vitamin B12 and Folate Lab Results  Component Value Date   VITAMINB12 290 05/03/2021      The ASCVD Risk score (Arnett DK, et al., 2019) failed to calculate for the following reasons:   The 2019 ASCVD risk score  is only valid for ages 75 to 72    Assessment & Plan:   Problem List Items Addressed This Visit   None Visit Diagnoses       Moderate persistent asthma with acute exacerbation    -  Primary   Relevant Medications   albuterol (PROVENTIL) (2.5 MG/3ML) 0.083% nebulizer solution   predniSONE (DELTASONE) 10 MG tablet   budesonide-formoterol (SYMBICORT) 160-4.5 MCG/ACT inhaler   methylPREDNISolone acetate (DEPO-MEDROL)  injection 80 mg (Completed)   Other Relevant Orders   Ambulatory referral to Allergy   DG Chest 2 View     Assessment and Plan Assessment & Plan Asthma exacerbation   She is experiencing a significant asthma exacerbation, likely triggered by pollen exposure and outdoor activities, with symptoms of severe wheezing, coughing, and nocturnal dyspnea. Current treatment with albuterol and montelukast is insufficient. A productive cough with green and gray sputum suggests possible infection or increased inflammation. No current fever, but a low-grade fever was present previously. Administer a steroid injection today to improve breathing, followed by a prednisone taper starting tomorrow. Add a daily inhaler (Symbicort) for maintenance therapy post-prednisone. Prescribe Tessalon Perles for cough management and albuterol for nebulizer use. Discussed risks of oral thrush with steroid inhaler use and advised on oral hygiene post-inhalation. Instruct on nebulizer use at home. Coordinate with Dr. Almeda Jacobs, an allergist, for further evaluation and management. Discussed nebulizer acquisition options, with prices ranging from $40 to $60, and arrange for a home health company to provide a nebulizer and instructions for use.    Return if symptoms worsen or fail to improve.    Mekel Haverstock R Lowne Chase, DO

## 2023-04-25 NOTE — Patient Instructions (Signed)
 Asthma, Adult  Asthma is a long-term (chronic) condition that causes recurrent episodes in which the lower airways in the lungs become tight and narrow. The narrowing is caused by inflammation and tightening of the smooth muscle around the lower airways. Asthma episodes, also called asthma attacks or asthma flares, may cause coughing, making high-pitched whistling sounds when you breathe, most often when you breathe out (wheezing), shortness of breath, and chest pain. The airways may produce extra mucus caused by the inflammation and irritation. During an attack, it can be difficult to breathe. Asthma attacks can range from minor to life-threatening. Asthma cannot be cured, but medicines and lifestyle changes can help control it and treat acute attacks. It is important to keep your asthma well controlled so the condition does not interfere with your daily life. What are the causes? This condition is believed to be caused by inherited (genetic) and environmental factors, but its exact cause is not known. What can trigger an asthma attack? Many things can bring on an asthma attack or make symptoms worse. These triggers are different for every person. Common triggers include: Allergens and irritants like mold, dust, pet dander, cockroaches, pollen, air pollution, and chemical odors. Cigarette smoke. Weather changes and cold air. Stress and strong emotional responses such as crying or laughing hard. Certain medications such as aspirin or beta blockers. Infections and inflammatory conditions, such as the flu, a cold, pneumonia, or inflammation of the nasal membranes (rhinitis). Gastroesophageal reflux disease (GERD). What are the signs or symptoms? Symptoms may occur right after exposure to an asthma trigger or hours later and can vary by person. Common signs and symptoms include: Wheezing. Trouble breathing (shortness of breath). Excessive nighttime or early morning coughing. Chest  tightness. Tiredness (fatigue) with minimal activity. Difficulty talking in complete sentences. Poor exercise tolerance. How is this diagnosed? This condition is diagnosed based on: A physical exam and your medical history. Tests, which may include: Lung function studies to evaluate the flow of air in your lungs. Allergy tests. Imaging tests, such as X-rays. How is this treated? There is no cure, but symptoms can be controlled with proper treatment. Treatment usually involves: Identifying and avoiding your asthma triggers. Inhaled medicines. Two types are commonly used to treat asthma, depending on severity: Controller medicines. These help prevent asthma symptoms from occurring. They are taken every day. Fast-acting reliever or rescue medicines. These quickly relieve asthma symptoms. They are used as needed and provide short-term relief. Using other medicines, such as: Allergy medicines, such as antihistamines, if your asthma attacks are triggered by allergens. Immune medicines (immunomodulators). These are medicines that help control the immune system. Using supplemental oxygen. This is only needed during a severe episode. Creating an asthma action plan. An asthma action plan is a written plan for managing and treating your asthma attacks. This plan includes: A list of your asthma triggers and how to avoid them. Information about when medicines should be taken and when their dosage should be changed. Instructions about using a device called a peak flow meter. A peak flow meter measures how well the lungs are working and the severity of your asthma. It helps you monitor your condition. Follow these instructions at home: Take over-the-counter and prescription medicines only as told by your health care provider. Stay up to date on all vaccinations as recommended by your healthcare provider, including vaccines for the flu and pneumonia. Use a peak flow meter and keep track of your peak flow  readings. Understand and use your asthma  action plan to address any asthma flares. Do not smoke or allow anyone to smoke in your home. Contact a health care provider if: You have wheezing, shortness of breath, or a cough that is not responding to medicines. Your medicines are causing side effects, such as a rash, itching, swelling, or trouble breathing. You need to use a reliever medicine more than 2-3 times a week. Your peak flow reading is still at 50-79% of your personal best after following your action plan for 1 hour. You have a fever and shortness of breath. Get help right away if: You are getting worse and do not respond to treatment during an asthma attack. You are short of breath when at rest or when doing very little physical activity. You have difficulty eating, drinking, or talking. You have chest pain or tightness. You develop a fast heartbeat or palpitations. You have a bluish color to your lips or fingernails. You are light-headed or dizzy, or you faint. Your peak flow reading is less than 50% of your personal best. You feel too tired to breathe normally. These symptoms may be an emergency. Get help right away. Call 911. Do not wait to see if the symptoms will go away. Do not drive yourself to the hospital. Summary Asthma is a long-term (chronic) condition that causes recurrent episodes in which the airways become tight and narrow. Asthma episodes, also called asthma attacks or asthma flares, can cause coughing, wheezing, shortness of breath, and chest pain. Asthma cannot be cured, but medicines and lifestyle changes can help keep it well controlled and prevent asthma flares. Make sure you understand how to avoid triggers and how and when to use your medicines. Asthma attacks can range from minor to life-threatening. Get help right away if you have an asthma attack and do not respond to treatment with your usual rescue medicines. This information is not intended to replace  advice given to you by your health care provider. Make sure you discuss any questions you have with your health care provider. Document Revised: 10/14/2020 Document Reviewed: 10/05/2020 Elsevier Patient Education  2024 [employer].

## 2023-05-09 ENCOUNTER — Encounter: Payer: BC Managed Care – PPO | Admitting: Family Medicine

## 2023-05-20 ENCOUNTER — Other Ambulatory Visit: Payer: Self-pay | Admitting: Family Medicine

## 2023-05-20 DIAGNOSIS — J45990 Exercise induced bronchospasm: Secondary | ICD-10-CM

## 2023-05-25 ENCOUNTER — Other Ambulatory Visit: Payer: Self-pay | Admitting: Family Medicine

## 2023-05-26 ENCOUNTER — Ambulatory Visit: Admitting: Obstetrics and Gynecology

## 2023-05-26 VITALS — BP 114/65 | HR 60 | Wt 197.0 lb

## 2023-05-26 DIAGNOSIS — Z3049 Encounter for surveillance of other contraceptives: Secondary | ICD-10-CM | POA: Diagnosis not present

## 2023-05-26 DIAGNOSIS — N914 Secondary oligomenorrhea: Secondary | ICD-10-CM

## 2023-05-26 MED ORDER — SLYND 4 MG PO TABS
1.0000 | ORAL_TABLET | Freq: Every day | ORAL | Status: DC
Start: 2023-05-26 — End: 2023-08-07

## 2023-05-28 LAB — FOLLICLE STIMULATING HORMONE: FSH: 3.2 m[IU]/mL

## 2023-05-28 LAB — PROLACTIN: Prolactin: 13 ng/mL (ref 4.8–33.4)

## 2023-05-28 LAB — TESTOSTERONE,FREE AND TOTAL
Testosterone, Free: 4.1 pg/mL (ref 0.0–4.2)
Testosterone: 99 ng/dL — ABNORMAL HIGH (ref 13–71)

## 2023-05-28 LAB — RPR+HBSAG+HCVAB+...
HIV Screen 4th Generation wRfx: NONREACTIVE
Hep C Virus Ab: NONREACTIVE
Hepatitis B Surface Ag: NEGATIVE
RPR Ser Ql: NONREACTIVE

## 2023-05-29 ENCOUNTER — Ambulatory Visit: Payer: Self-pay | Admitting: Obstetrics and Gynecology

## 2023-05-29 NOTE — Progress Notes (Signed)
 GYNECOLOGY VISIT  Patient name: Allison Campbell MRN [id number]  Date of birth: 2000-03-27 Chief Complaint:   Menstrual Problem  History:  Allison Campbell is a 23 y.o. G0P0000 being seen today for AUB.   Discussed the use of AI scribe software for clinical note transcription with the patient, who gave verbal consent to proceed.  History of Present Illness Allison Campbell is a 23 year old female who presents with irregular menstrual cycles and associated symptoms.  She experiences irregular menstrual cycles, missing periods for two to three months at a time. Her periods had returned to regularity at one point but have since become irregular again. When menstruation occurs, it is described as 'goopy' and 'slimy,' requiring frequent wiping rather than being absorbed by a pad. No vaginal dryness or spotting between periods, but she experiences hot flashes lasting a few minutes.  She has gained approximately twenty pounds over a short period and is concerned about the rapid weight gain's potential connection to her menstrual irregularities. She works at [employer], a job that requires her to be active and on her feet most of the time.  Her medication history includes a recent course of prednisone  for breathing issues and past use of a Z-Pak and steroids following a bug bite. She has not started any new medications recently, except for Vitamin D  supplementation.  She has a history of using various forms of birth control, including Slynd , which she stopped after a short period. She previously used a vaginal ring, discontinued due to an episode of pancreatitis, suspected to be related to high estrogen levels. She has also used Junel in the past, which resulted in no periods unless a pill was missed. She is not currently on any birth control. Her previous OBGYN advised against any estrogen-containing birth control after her episode of pancreatitis.    Past Medical History:  Diagnosis Date   Asthma    cough  variant worse with infection   Atypical chest pain 02/10/2020   Mild intermittent asthma 07/06/2011   Tinea versicolor 02/10/2020    Past Surgical History:  Procedure Laterality Date   WISDOM TOOTH EXTRACTION      The following portions of the patient's history were reviewed and updated as appropriate: allergies, current medications, past family history, past medical history, past social history, past surgical history and problem list.   Health Maintenance:   Last pap     Component Value Date/Time   DIAGPAP  07/27/2021 0944    - Negative for intraepithelial lesion or malignancy (NILM)   ADEQPAP  07/27/2021 0944    Satisfactory for evaluation; transformation zone component PRESENT.    Last mammogram: n/a   Review of Systems:  Pertinent items are noted in HPI. Comprehensive review of systems was otherwise negative.   Objective:  Physical Exam BP 114/65 (BP Location: Left Arm, Patient Position: Sitting, Cuff Size: Normal)   Pulse 60   Wt 197 lb (89.4 kg)   LMP 04/26/2023 (Exact Date)   BMI 29.95 kg/m    Physical Exam Vitals and nursing note reviewed.  Constitutional:      Appearance: Normal appearance.  HENT:     Head: Normocephalic and atraumatic.  Pulmonary:     Effort: Pulmonary effort is normal.  Skin:    General: Skin is warm and dry.  Neurological:     General: No focal deficit present.     Mental Status: She is alert.  Psychiatric:        Mood and Affect: Mood  normal.        Behavior: Behavior normal.        Thought Content: Thought content normal.        Judgment: Judgment normal.         Assessment & Plan:   Assessment & Plan Irregular menstrual cycles Episodes of amenorrhea and irregular cycles suggest possible PCOS. Discontinued Slynd  due to side effects concerns. Discussed Slynd 's benefits for cycle regulation and uterine lining protection. - Order pelvic ultrasound and labs including STI testing. - Prescribe Slynd  for cycle regulation. -  Schedule follow-up to review ultrasound results, lab findings, and response to Slynd .  Weight gain Weight gain possibly linked to hormonal imbalances, likely PCOS. Emphasized lifestyle modifications and dietitian consultation for weight management.  Pancreatitis Potential link to estrogen-containing contraceptives. Advised avoidance of estrogen products. Current plan involves progesterone-only options. - Avoid estrogen-containing contraceptives.  Routine preventative health maintenance measures emphasized.  Kiki Pelton, MD Minimally Invasive Gynecologic Surgery Center for [facility], [facility]

## 2023-06-02 ENCOUNTER — Ambulatory Visit (HOSPITAL_BASED_OUTPATIENT_CLINIC_OR_DEPARTMENT_OTHER): Admission: RE | Admit: 2023-06-02 | Source: Ambulatory Visit

## 2023-06-02 ENCOUNTER — Ambulatory Visit (HOSPITAL_BASED_OUTPATIENT_CLINIC_OR_DEPARTMENT_OTHER)
Admission: RE | Admit: 2023-06-02 | Discharge: 2023-06-02 | Disposition: A | Discharge status: Home / Self Care | Source: Ambulatory Visit | Attending: Obstetrics and Gynecology | Admitting: Obstetrics and Gynecology

## 2023-06-02 DIAGNOSIS — N914 Secondary oligomenorrhea: Secondary | ICD-10-CM | POA: Insufficient documentation

## 2023-06-16 ENCOUNTER — Telehealth: Admitting: Obstetrics and Gynecology

## 2023-06-16 DIAGNOSIS — N914 Secondary oligomenorrhea: Secondary | ICD-10-CM

## 2023-06-16 DIAGNOSIS — R9389 Abnormal findings on diagnostic imaging of other specified body structures: Secondary | ICD-10-CM

## 2023-06-16 NOTE — Progress Notes (Signed)
 GYNECOLOGY VIRTUAL VISIT ENCOUNTER NOTE  Provider location: Center for [facility]   Patient location: Home  I connected with Darlena Ego on 06/16/23 at 11:15 AM EDT by MyChart Video Encounter and verified that I am speaking with the correct person using two identifiers.   I discussed the limitations, risks, security and privacy concerns of performing an evaluation and management service virtually and the availability of in person appointments. I also discussed with the patient that there may be a patient responsible charge related to this service. The patient expressed understanding and agreed to proceed.   History:  Berenize Gatlin is a 23 y.o. G0P0000 female being evaluated today for US  follow up.   Period came on the day following her ultrasound. Started medication this past Sunday after her menses had concluded and now 4 days in to taking the medication. Feels that her back acne has flared while taking it and on the second day could feel "something was going on" insider of her  When the period came on she lost 10lbs instantly Given her elevated testosterone , wondering if anything to be done to lower testosterone  and if it should be checked She has concerns regarding her future fertility   Past Medical History:  Diagnosis Date   Asthma    cough variant worse with infection   Atypical chest pain 02/10/2020   Mild intermittent asthma 07/06/2011   Tinea versicolor 02/10/2020   Past Surgical History:  Procedure Laterality Date   WISDOM TOOTH EXTRACTION     The following portions of the patient's history were reviewed and updated as appropriate: allergies, current medications, past family history, past medical history, past social history, past surgical history and problem list.    Review of Systems:  Pertinent items noted in HPI and remainder of comprehensive ROS otherwise negative.  Physical Exam:   General:  Alert, oriented and cooperative. Patient appears  to be in no acute distress.  Mental Status: Normal mood and affect. Normal behavior. Normal judgment and thought content.   Respiratory: Normal respiratory effort, no problems with respiration noted  Rest of physical exam deferred due to type of encounter  Labs and Imaging No results found for this or any previous visit (from the past 2 weeks). US  PELVIC COMPLETE WITH TRANSVAGINAL Result Date: 06/13/2023 CLINICAL DATA:  Irregular menses EXAM: TRANSABDOMINAL AND TRANSVAGINAL ULTRASOUND OF PELVIS TECHNIQUE: Both transabdominal and transvaginal ultrasound examinations of the pelvis were performed. Transabdominal technique was performed for global imaging of the pelvis including uterus, ovaries, adnexal regions, and pelvic cul-de-sac. It was necessary to proceed with endovaginal exam following the transabdominal exam to visualize the ovaries and endometrium. COMPARISON:  None Available. FINDINGS: Uterus Measurements: 7.4 x 4.2 x 4.7 cm = volume: 76 mL. No fibroids or other mass visualized. Endometrium Thickness: 17.7. Single 2 mm cystic structure seen in the endometrium. The endometrium is diffusely hypervascular. Right ovary Measurements: 4.5 x 2.1 x 2.5 cm = volume: 12.2 mL. Normal appearance/no adnexal mass. Left ovary Measurements: 4.2 x 2.6 x 2.3 cm = volume: 12.8 mL. Normal appearance/no adnexal mass. Other findings No abnormal free fluid. IMPRESSION: Thickened hypervascular endometrium. Recommend follow-up with gynecology. Electronically Signed   By: Tyron Gallon M.D.   On: 06/13/2023 23:46       Assessment and Plan:     1. Secondary oligomenorrhea (Primary) Continue slynd  and observe symptoms. Use of topical solutions/wash for back acne.  - US  PELVIC COMPLETE WITH TRANSVAGINAL; Future  2. Abnormal ultrasound Discussed  options of repeat US  vs endometrial sampling. Patient prefers interval imaging. Noted that if US  remains abnormal, recommend endometrial sampling.  - US  PELVIC COMPLETE WITH  TRANSVAGINAL; Future       I discussed the assessment and treatment plan with the patient. The patient was provided an opportunity to ask questions and all were answered. The patient agreed with the plan and demonstrated an understanding of the instructions.   The patient was advised to call back or seek an in-person evaluation/go to the ED if the symptoms worsen or if the condition fails to improve as anticipated.  I provided 8 minutes of face-to-face time during this encounter. I also spent 5 minutes dedicated to the care of this patient including pre-visit review of records, post visit ordering of medications and appropriate tests or procedures, coordinating care and documenting this visit encounter.    Kiki Pelton, MD Center for [employer], [facility]

## 2023-07-01 ENCOUNTER — Ambulatory Visit
Admission: EM | Admit: 2023-07-01 | Discharge: 2023-07-01 | Disposition: A | Discharge status: Home / Self Care | Attending: Emergency Medicine | Admitting: Emergency Medicine

## 2023-07-01 ENCOUNTER — Encounter: Payer: Self-pay | Admitting: Emergency Medicine

## 2023-07-01 DIAGNOSIS — J4521 Mild intermittent asthma with (acute) exacerbation: Secondary | ICD-10-CM

## 2023-07-01 DIAGNOSIS — R0602 Shortness of breath: Secondary | ICD-10-CM

## 2023-07-01 MED ORDER — PREDNISONE 10 MG (21) PO TBPK: ORAL_TABLET | Freq: Every day | ORAL | 0 refills | Status: DC

## 2023-07-01 NOTE — Discharge Instructions (Signed)
 Today you were evaluated for your shortness of breath, on exam your lungs are clear and you are getting enough air without assistance most likely asthma flared by fluctuating weather  Start your daily medicines as directed to see if this improves her symptoms however if ineffective initiate steroids  Take every morning with food to open and relax the airway  If your symptoms continue to persist please follow-up for reevaluation

## 2023-07-01 NOTE — ED Provider Notes (Signed)
 Allison Campbell    CSN: [id number] Arrival date & time: 07/01/23  1036      History   Chief Complaint Chief Complaint  Patient presents with   Shortness of Breath    HPI Allison Campbell is a 23 y.o. female.   Patient presents for evaluation of shortness of breath beginning 1 day ago.  Initially felt with exertion as she was walking up a hill but this morning felt upon awakening without activity.  Complaining centralized chest tightness.  Has attempted use of albuterol  inhaler today which has provided minimal relief.  Usually has taken Tessalon  and Singulair , endorses that she has not been using daily inhaler as directed.  Denies wheezing, cough, fever or URI symptoms.  Past Medical History:  Diagnosis Date   Asthma    cough variant worse with infection   Atypical chest pain 02/10/2020   Mild intermittent asthma 07/06/2011   Tinea versicolor 02/10/2020    Patient Active Problem List   Diagnosis Date Noted   Hyperlipidemia 09/01/2022   Chronic constipation 07/13/2022   Hyperglycemia 05/08/2022   Acute pancreatitis 01/13/2022   Left upper quadrant abdominal pain 01/11/2022   GERD (gastroesophageal reflux disease) 01/11/2022   Left shoulder pain 01/11/2022   Allergies 09/10/2021   PTSD (post-traumatic stress disorder) 06/11/2021   Vitamin D  deficiency 12/30/2020   Anemia 12/29/2020   Encounter for contraceptive management 12/29/2020   Tinea versicolor 02/10/2020   Preventative health care 02/10/2020   Enlarged thyroid  02/10/2020   Mild intermittent asthma 07/06/2011    Past Surgical History:  Procedure Laterality Date   WISDOM TOOTH EXTRACTION      OB History     Gravida  0   Para  0   Term  0   Preterm  0   AB  0   Living  0      SAB  0   IAB  0   Ectopic  0   Multiple  0   Live Births  0            Home Medications    Prior to Admission medications   Medication Sig Start Date End Date Taking? Authorizing Provider  predniSONE   (STERAPRED UNI-PAK 21 TAB) 10 MG (21) TBPK tablet Take by mouth daily. Take 6 tabs by mouth daily  for 1 days, then 5 tabs for 1 days, then 4 tabs for 1 days, then 3 tabs for 1 days, 2 tabs for 1 days, then 1 tab by mouth daily for 1 days 07/01/23  Yes Loda Bialas R, NP  albuterol  (PROVENTIL ) (2.5 MG/3ML) 0.083% nebulizer solution Take 3 mLs (2.5 mg total) by nebulization every 6 (six) hours as needed for wheezing or shortness of breath. 04/25/23   Antonio Cyndee Rockers R, DO  albuterol  (VENTOLIN  HFA) 108 (90 Base) MCG/ACT inhaler INHALE 2 PUFFS INTO THE LUNGS EVERY 6 (SIX) HOURS AS NEEDED FOR WHEEZING OR SHORTNESS OF BREATH. USE 20-30 MINS PRIOR TO EXERCISE 05/22/23   Domenica Harlene LABOR, MD  benzonatate  (TESSALON  PERLES) 100 MG capsule 1-2 po tid as needed Patient not taking: Reported on 05/26/2023 04/25/23   Lowne Chase, Yvonne R, DO  brompheniramine-pseudoephedrine -DM 30-2-10 MG/5ML syrup Take 5 mLs by mouth 4 (four) times daily as needed. Patient not taking: Reported on 03/27/2023 08/02/22   Corlis Burnard DEL, NP  budesonide -formoterol  (SYMBICORT ) 160-4.5 MCG/ACT inhaler Inhale 2 puffs into the lungs 2 (two) times daily. 04/25/23   Antonio Cyndee Rockers R, DO  cetirizine  (ZYRTEC ) 10 MG  chewable tablet Chew by mouth.    [provider]  Drospirenone  (SLYND ) 4 MG TABS Take 1 tablet (4 mg total) by mouth daily. 05/26/23   Ajewole, Christana, MD  montelukast  (SINGULAIR ) 10 MG tablet Take 1 tablet (10 mg total) by mouth at bedtime. 03/27/23   Corlis Burnard DEL, NP  Multiple Vitamins-Minerals (MULTIVITAMIN WITH MINERALS) tablet Take 1 tablet by mouth daily. Patient not taking: Reported on 05/26/2023 02/11/22   Daryl Setter, NP  omeprazole  (PRILOSEC) 40 MG capsule Take 1 capsule (40 mg total) by mouth daily. Patient not taking: Reported on 05/26/2023 01/11/22   Daryl Setter, NP  Vitamin D , Ergocalciferol , (DRISDOL ) 1.25 MG (50000 UNIT) CAPS capsule TAKE 1 CAPSULE (50,000 UNITS TOTAL) BY MOUTH EVERY 7 (SEVEN)  DAYS 05/25/23   Domenica Harlene LABOR, MD    Family History Family History  Problem Relation Age of Onset   Diabetes Maternal Grandfather    Stroke Paternal Grandmother    Edema Paternal Grandmother    Heart disease Paternal Grandfather    Thyroid  disease Mother        thyroid  cancer   Hypothyroidism Mother    Hypertension Father    Hypothyroidism Maternal Grandmother    Asthma Maternal Grandmother    Cancer Neg Hx     Social History Social History   Tobacco Use   Smoking status: Never   Smokeless tobacco: Never  Vaping Use   Vaping status: Never Used  Substance Use Topics   Alcohol use: Yes    Comment: Socially   Drug use: No     Allergies   Amoxicillin and Penicillins   Review of Systems Review of Systems   Physical Exam Triage Vital Signs ED Triage Vitals  Encounter Vitals Group     BP 07/01/23 1121 118/68     Girls Systolic BP Percentile --      Girls Diastolic BP Percentile --      Boys Systolic BP Percentile --      Boys Diastolic BP Percentile --      Pulse Rate 07/01/23 1121 90     Resp 07/01/23 1121 18     Temp 07/01/23 1121 98.2 F (36.8 C)     Temp Source 07/01/23 1121 Oral     SpO2 07/01/23 1121 98 %     Weight --      Height --      Head Circumference --      Peak Flow --      Pain Score 07/01/23 1127 0     Pain Loc --      Pain Education --      Exclude from Growth Chart --    No data found.  Updated Vital Signs BP 118/68 (BP Location: Left Arm)   Pulse 90   Temp 98.2 F (36.8 C) (Oral)   Resp 18   LMP 06/26/2023 (Approximate) Comment: still on  SpO2 98%   Visual Acuity Right Eye Distance:   Left Eye Distance:   Bilateral Distance:    Right Eye Near:   Left Eye Near:    Bilateral Near:     Physical Exam Constitutional:      Appearance: Normal appearance.   Eyes:     Extraocular Movements: Extraocular movements intact.    Cardiovascular:     Rate and Rhythm: Normal rate and regular rhythm.     Pulses: Normal pulses.      Heart sounds: Normal heart sounds.  Pulmonary:     Effort: Pulmonary  effort is normal.     Breath sounds: Normal breath sounds.   Neurological:     Mental Status: She is alert and oriented to person, place, and time.      UC Treatments / Results  Labs (all labs ordered are listed, but only abnormal results are displayed) Labs Reviewed - No data to display  EKG   Radiology No results found.  Procedures Procedures (including critical care time)  Medications Ordered in UC Medications - No data to display  Initial Impression / Assessment and Plan / UC Course  I have reviewed the triage vital signs and the nursing notes.  Pertinent labs & imaging results that were available during my care of the patient were reviewed by me and considered in my medical decision making (see chart for details).  Intermittent asthma with acute exacerbation, shortness of breath  Vital signs are stable, O2 saturation 98% on room air, lungs clear to auscultation, denying all URI symptoms low suspicion for pneumonia therefore imaging deferred, etiology most likely exacerbation of asthma, discussed with patient, stable for outpatient management, would like to move forward with taking daily medicine as directed to see if this will provide improvement, prednisone 's sent to pharmacy if ineffective, advise follow-up as needed Final Clinical Impressions(s) / UC Diagnoses   Final diagnoses:  Shortness of breath  Mild intermittent asthma with acute exacerbation     Discharge Instructions      Today you were evaluated for your shortness of breath, on exam your lungs are clear and you are getting enough air without assistance most likely asthma flared by fluctuating weather  Start your daily medicines as directed to see if this improves her symptoms however if ineffective initiate steroids  Take every morning with food to open and relax the airway  If your symptoms continue to persist please  follow-up for reevaluation   ED Prescriptions     Medication Sig Dispense Auth. Provider   predniSONE  (STERAPRED UNI-PAK 21 TAB) 10 MG (21) TBPK tablet Take by mouth daily. Take 6 tabs by mouth daily  for 1 days, then 5 tabs for 1 days, then 4 tabs for 1 days, then 3 tabs for 1 days, 2 tabs for 1 days, then 1 tab by mouth daily for 1 days 21 tablet Yichen Gilardi, Shelba SAUNDERS, NP      PDMP not reviewed this encounter.   Teresa Shelba SAUNDERS, [place] 07/01/23 1217

## 2023-07-01 NOTE — ED Triage Notes (Signed)
 Patient reports history asthma. Patient reports that yesterday walking up hill and got really SOB. Today up felt really SOB was not doing anything. Patient reports she used Albuterol  inhaler in relieved symptoms. Patient reports starting a new birth control pill 3 weeks ago. Denies pain at this time.

## 2023-07-07 ENCOUNTER — Telehealth: Payer: Self-pay

## 2023-07-07 NOTE — Telephone Encounter (Signed)
 Copied from CRM [id number]. Topic: Clinical - Medical Advice >> Jul 07, 2023 10:43 AM Turkey A wrote: Reason for CRM: Patient said that someone was to call her over a month ago regarding her nebulizer and to come to the home to set-please contact

## 2023-07-10 NOTE — Telephone Encounter (Signed)
 Order faxed with OV note and demo

## 2023-07-31 ENCOUNTER — Encounter: Admitting: Family Medicine

## 2023-08-07 ENCOUNTER — Other Ambulatory Visit: Payer: Self-pay

## 2023-08-07 ENCOUNTER — Other Ambulatory Visit: Payer: Self-pay | Admitting: Obstetrics and Gynecology

## 2023-08-07 DIAGNOSIS — Z3049 Encounter for surveillance of other contraceptives: Secondary | ICD-10-CM

## 2023-08-07 MED ORDER — SLYND 4 MG PO TABS: 1.0000 | ORAL_TABLET | Freq: Every day | ORAL | 3 refills | Status: DC

## 2023-08-09 NOTE — Assessment & Plan Note (Deleted)
 Encourage heart healthy diet such as MIND or DASH diet, increase exercise, avoid trans fats, simple carbohydrates and processed foods, consider a krill or fish or flaxseed oil cap daily.

## 2023-08-09 NOTE — Assessment & Plan Note (Deleted)
Patient encouraged to maintain heart healthy diet, regular exercise, adequate sleep. Consider daily probiotics. Take medications as prescribed. Labs ordered and reviewed 

## 2023-08-09 NOTE — Assessment & Plan Note (Deleted)
 Supplement and monitor

## 2023-08-09 NOTE — Assessment & Plan Note (Deleted)
 hgba1c acceptable, minimize simple carbs. Increase exercise as tolerated.

## 2023-08-09 NOTE — Assessment & Plan Note (Deleted)
 No recent exacerbation

## 2023-08-10 ENCOUNTER — Encounter: Admitting: Family Medicine

## 2023-08-10 DIAGNOSIS — J4520 Mild intermittent asthma, uncomplicated: Secondary | ICD-10-CM

## 2023-08-10 DIAGNOSIS — E785 Hyperlipidemia, unspecified: Secondary | ICD-10-CM

## 2023-08-10 DIAGNOSIS — Z0000 Encounter for general adult medical examination without abnormal findings: Secondary | ICD-10-CM

## 2023-08-10 DIAGNOSIS — R739 Hyperglycemia, unspecified: Secondary | ICD-10-CM

## 2023-08-10 DIAGNOSIS — E559 Vitamin D deficiency, unspecified: Secondary | ICD-10-CM

## 2023-08-14 ENCOUNTER — Ambulatory Visit (HOSPITAL_COMMUNITY)

## 2023-08-21 ENCOUNTER — Ambulatory Visit (HOSPITAL_COMMUNITY)
Admission: RE | Admit: 2023-08-21 | Discharge: 2023-08-21 | Disposition: A | Discharge status: Home / Self Care | Source: Ambulatory Visit | Attending: Obstetrics and Gynecology | Admitting: Obstetrics and Gynecology

## 2023-08-21 DIAGNOSIS — R9389 Abnormal findings on diagnostic imaging of other specified body structures: Secondary | ICD-10-CM | POA: Diagnosis present

## 2023-08-21 DIAGNOSIS — N914 Secondary oligomenorrhea: Secondary | ICD-10-CM | POA: Insufficient documentation

## 2023-09-03 NOTE — Assessment & Plan Note (Signed)
 Patient encouraged to maintain heart healthy diet, regular exercise, adequate sleep. Consider daily probiotics. Take medications as prescribed. Labs ordered and reviewed  Pap

## 2023-09-03 NOTE — Assessment & Plan Note (Signed)
 Encourage heart healthy diet such as MIND or DASH diet, increase exercise, avoid trans fats, simple carbohydrates and processed foods, consider a krill or fish or flaxseed oil cap daily.

## 2023-09-03 NOTE — Assessment & Plan Note (Signed)
 No recent exacerbation

## 2023-09-03 NOTE — Assessment & Plan Note (Signed)
 Supplement and monitor

## 2023-09-03 NOTE — Assessment & Plan Note (Signed)
 hgba1c acceptable, minimize simple carbs. Increase exercise as tolerated.

## 2023-09-04 ENCOUNTER — Ambulatory Visit (INDEPENDENT_AMBULATORY_CARE_PROVIDER_SITE_OTHER): Admitting: Family Medicine

## 2023-09-04 ENCOUNTER — Ambulatory Visit: Payer: Self-pay | Admitting: Obstetrics and Gynecology

## 2023-09-04 VITALS — BP 122/80 | HR 81 | Resp 16 | Ht 68.0 in | Wt 186.8 lb

## 2023-09-04 DIAGNOSIS — J4520 Mild intermittent asthma, uncomplicated: Secondary | ICD-10-CM | POA: Diagnosis not present

## 2023-09-04 DIAGNOSIS — E785 Hyperlipidemia, unspecified: Secondary | ICD-10-CM

## 2023-09-04 DIAGNOSIS — Z0000 Encounter for general adult medical examination without abnormal findings: Secondary | ICD-10-CM

## 2023-09-04 DIAGNOSIS — E559 Vitamin D deficiency, unspecified: Secondary | ICD-10-CM

## 2023-09-04 DIAGNOSIS — R739 Hyperglycemia, unspecified: Secondary | ICD-10-CM

## 2023-09-04 LAB — COMPREHENSIVE METABOLIC PANEL WITH GFR
ALT: 14 U/L (ref 0–35)
AST: 17 U/L (ref 0–37)
Albumin: 4.5 g/dL (ref 3.5–5.2)
Alkaline Phosphatase: 62 U/L (ref 39–117)
BUN: 10 mg/dL (ref 6–23)
CO2: 27 meq/L (ref 19–32)
Calcium: 8.9 mg/dL (ref 8.4–10.5)
Chloride: 104 meq/L (ref 96–112)
Creatinine, Ser: 0.81 mg/dL (ref 0.40–1.20)
GFR: 102.18 mL/min (ref >60.00)
Glucose, Bld: 95 mg/dL (ref 70–99)
Potassium: 4.5 meq/L (ref 3.5–5.1)
Sodium: 140 meq/L (ref 135–145)
Total Bilirubin: 0.4 mg/dL (ref 0.2–1.2)
Total Protein: 7.4 g/dL (ref 6.0–8.3)

## 2023-09-04 LAB — CBC WITH DIFFERENTIAL/PLATELET
Basophils Absolute: 0 K/uL (ref 0.0–0.1)
Basophils Relative: 0.4 % (ref 0.0–3.0)
Eosinophils Absolute: 0.2 K/uL (ref 0.0–0.7)
Eosinophils Relative: 1.8 % (ref 0.0–5.0)
HCT: 34.9 % — ABNORMAL LOW (ref 36.0–46.0)
Hemoglobin: 11.6 g/dL — ABNORMAL LOW (ref 12.0–15.0)
Lymphocytes Relative: 32 % (ref 12.0–46.0)
Lymphs Abs: 2.8 K/uL (ref 0.7–4.0)
MCHC: 33.2 g/dL (ref 30.0–36.0)
MCV: 88.1 fl (ref 78.0–100.0)
Monocytes Absolute: 0.5 K/uL (ref 0.1–1.0)
Monocytes Relative: 5.4 % (ref 3.0–12.0)
Neutro Abs: 5.2 K/uL (ref 1.4–7.7)
Neutrophils Relative %: 60.4 % (ref 43.0–77.0)
Platelets: 269 K/uL (ref 150.0–400.0)
RBC: 3.97 Mil/uL (ref 3.87–5.11)
RDW: 13.1 % (ref 11.5–15.5)
WBC: 8.7 K/uL (ref 4.0–10.5)

## 2023-09-04 LAB — LIPID PANEL
Cholesterol: 169 mg/dL (ref 0–200)
HDL: 47.6 mg/dL (ref >39.00)
LDL Cholesterol: 104 mg/dL — ABNORMAL HIGH (ref 0–99)
NonHDL: 121.07
Total CHOL/HDL Ratio: 4
Triglycerides: 83 mg/dL (ref 0.0–149.0)
VLDL: 16.6 mg/dL (ref 0.0–40.0)

## 2023-09-04 LAB — TSH: TSH: 1.94 u[IU]/mL (ref 0.35–5.50)

## 2023-09-04 LAB — HEMOGLOBIN A1C: Hgb A1c MFr Bld: 5.9 % (ref 4.6–6.5)

## 2023-09-04 LAB — VITAMIN D 25 HYDROXY (VIT D DEFICIENCY, FRACTURES): VITD: 11.94 ng/mL — ABNORMAL LOW (ref 30.00–100.00)

## 2023-09-04 MED ORDER — LORATADINE 10 MG PO TABS: 10.0000 mg | ORAL_TABLET | Freq: Every day | ORAL | Status: AC
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
## 2023-09-04 NOTE — Patient Instructions (Signed)

## 2023-09-05 ENCOUNTER — Ambulatory Visit: Payer: Self-pay | Admitting: Family Medicine

## 2023-09-05 DIAGNOSIS — E559 Vitamin D deficiency, unspecified: Secondary | ICD-10-CM

## 2023-09-05 MED ORDER — VITAMIN D3 50 MCG (2000 UT) PO CAPS
2000.0000 [IU] | ORAL_CAPSULE | Freq: Every day | ORAL | Status: AC
Start: 2023-09-05 — End: ?

## 2023-09-05 MED ORDER — VITAMIN D (ERGOCALCIFEROL) 1.25 MG (50000 UNIT) PO CAPS: 50000.0000 [IU] | ORAL_CAPSULE | ORAL | 5 refills | Status: AC

## 2023-12-04 NOTE — Progress Notes (Deleted)
    GYNECOLOGY VISIT  Patient name: Allison Campbell MRN [id number]  Date of birth: [birth year] Chief Complaint:   No chief complaint on file.   History:  Discussed the use of AI scribe software for clinical note transcription with the patient, who gave verbal consent to proceed.  History of Present Illness   Chart review: 6/6 continue slynd  and use topical solutions for back acne. Interval US  to reassess lining    The following portions of the patient's history were reviewed and updated as appropriate: allergies, current medications, past family history, past medical history, past social history, past surgical history and problem list.   Health Maintenance:   Last pap     Component Value Date/Time   DIAGPAP  07/27/2021 0944    - Negative for intraepithelial lesion or malignancy (NILM)   ADEQPAP  07/27/2021 0944    Satisfactory for evaluation; transformation zone component PRESENT.    Health Maintenance  Topic Date Due   Pneumococcal Vaccine (2 of 2 - PPSV23, PCV20, or PCV21) 03/18/2015   Chlamydia screening  07/22/2023   Flu Shot  08/11/2023   COVID-19 Vaccine (3 - 2025-26 season) 09/11/2023   Pap Smear  07/27/2024   DTaP/Tdap/Td vaccine (7 - Td or Tdap) 05/17/2032   HPV Vaccine  Completed   Hepatitis C Screening  Completed   HIV Screening  Completed   Meningitis B Vaccine  Completed      Review of Systems:  {Ros - complete:30496} Comprehensive review of systems was otherwise negative.   Objective:  Physical Exam There were no vitals taken for this visit.   Physical Exam   Labs and Imaging IMPRESSION: 1. Normal endometrial thickness. 2. 2 cm right ovarian follicle, normal finding. 3. No other significant finding by pelvic ultrasound     Assessment & Plan:  Assessment and Plan Assessment & Plan        *** Routine preventative health maintenance measures emphasized.  Carter Quarry, MD [facility]  Healthcare, [facility]

## 2023-12-06 ENCOUNTER — Ambulatory Visit: Admitting: Obstetrics and Gynecology

## 2023-12-11 ENCOUNTER — Other Ambulatory Visit: Payer: Self-pay

## 2023-12-11 ENCOUNTER — Other Ambulatory Visit

## 2023-12-11 DIAGNOSIS — Z3049 Encounter for surveillance of other contraceptives: Secondary | ICD-10-CM

## 2023-12-11 MED ORDER — SLYND 4 MG PO TABS: 1.0000 | ORAL_TABLET | Freq: Every day | ORAL | 3 refills | Status: AC

## 2024-01-19 ENCOUNTER — Ambulatory Visit: Admitting: Obstetrics and Gynecology

## 2024-02-22 ENCOUNTER — Encounter: Admitting: Family Medicine
